# Patient Record
Sex: Female | Born: 1985 | Race: Black or African American
Health system: Southern US, Community
[De-identification: ages and names within clinical notes are randomized; demographics above are authoritative.]

## PROBLEM LIST (undated history)

## (undated) DIAGNOSIS — A64 Unspecified sexually transmitted disease: Secondary | ICD-10-CM

## (undated) DIAGNOSIS — G4730 Sleep apnea, unspecified: Secondary | ICD-10-CM

## (undated) DIAGNOSIS — E669 Obesity, unspecified: Secondary | ICD-10-CM

## (undated) DIAGNOSIS — J302 Other seasonal allergic rhinitis: Secondary | ICD-10-CM

## (undated) DIAGNOSIS — J189 Pneumonia, unspecified organism: Secondary | ICD-10-CM

## (undated) DIAGNOSIS — E785 Hyperlipidemia, unspecified: Secondary | ICD-10-CM

## (undated) DIAGNOSIS — F32A Depression, unspecified: Secondary | ICD-10-CM

## (undated) DIAGNOSIS — F419 Anxiety disorder, unspecified: Secondary | ICD-10-CM

## (undated) DIAGNOSIS — Z87898 Personal history of other specified conditions: Secondary | ICD-10-CM

## (undated) DIAGNOSIS — Z9109 Other allergy status, other than to drugs and biological substances: Secondary | ICD-10-CM

## (undated) DIAGNOSIS — R519 Headache, unspecified: Secondary | ICD-10-CM

## (undated) DIAGNOSIS — K219 Gastro-esophageal reflux disease without esophagitis: Secondary | ICD-10-CM

## (undated) DIAGNOSIS — R2240 Localized swelling, mass and lump, unspecified lower limb: Secondary | ICD-10-CM

## (undated) DIAGNOSIS — A6000 Herpesviral infection of urogenital system, unspecified: Secondary | ICD-10-CM

## (undated) HISTORY — DX: Depression, unspecified: F32.A

## (undated) HISTORY — DX: Other seasonal allergic rhinitis: J30.2

## (undated) HISTORY — PX: COSMETIC SURGERY: SHX468

## (undated) HISTORY — DX: Personal history of other specified conditions: Z87.898

## (undated) HISTORY — DX: Pneumonia, unspecified organism: J18.9

## (undated) HISTORY — DX: Gastro-esophageal reflux disease without esophagitis: K21.9

## (undated) HISTORY — DX: Hyperlipidemia, unspecified: E78.5

## (undated) HISTORY — DX: Morbid (severe) obesity due to excess calories: E66.01

## (undated) HISTORY — DX: Anxiety disorder, unspecified: F41.9

## (undated) HISTORY — DX: Obesity, unspecified: E66.9

## (undated) HISTORY — PX: TONSILLECTOMY: SHX5618

## (undated) HISTORY — DX: Unspecified sexually transmitted disease: A64

## (undated) HISTORY — PX: EXCISION CYST/LIPOMA: SHX510800

## (undated) HISTORY — DX: Sleep apnea, unspecified: G47.30

## (undated) HISTORY — DX: Headache, unspecified: R51.9

## (undated) HISTORY — PX: TONSILLECTOMY: SUR1361

## (undated) NOTE — Anesthesia Preprocedure Evaluation (Signed)
 Formatting of this note might be different from the original.  Pre-Sedation Evaluation    Pre-Sedation Assessment    Patient has been reassessed immediately prior to the procedure and is an appropriate candidate for the planned sedation and procedure. Risk, benefits, and alternatives, to the procedure and planned sedation have been explained to the patient (next of kin/guardian).: Yes      ASA classification: class 2 - patient with mild systemic disease      Physical Evaluation    Rhythm: regular      Rate: normal      Lung Exam: effort normal      Mallampati Score:  II - soft palate, uvula, fauces visible    Previous Anesthesia problems?:  No    Post-procedure Recovery Destination  Post-procedure recovery destination: Recovery unit    Procedure Description:      Electronically signed by Alena An (M.D.), M.D. at 02/28/2022  8:12 AM EST

## (undated) NOTE — Nursing Note (Signed)
 Formatting of this note might be different from the original.  Performed independent double check with  Nurse Alita Irwin, Tiffany prior to the administration of Midazolam (VERSED) and fentaNYL (PF) (SUBLIMAZE). See chart for total dosage.    Completed and verified by:  Claudene Crystal RN, February 28, 2022, 7:58 AM  Electronically signed by Claudene Crystal (R.N.), R.N. at 02/28/2022  7:58 AM EST

## (undated) NOTE — Nursing Note (Signed)
 Formatting of this note might be different from the original.  Intra-Op Meds given  fentanyl 200 mcg and midazolam 6 mg.    Intra-Op Meds wasted   fentanyl 0 mcg and midazolam 0 mg.     Returned 0 vial fentanyl and 0 vial midazolam     with Hebert Littler RN.    Electronically signed by Claudene Crystal (R.N.), R.N. at 02/28/2022  8:20 AM EST

## (undated) NOTE — Nursing Note (Signed)
 Formatting of this note might be different from the original.  Pt awake and tolerating snacks-given D/C instructions and follow up instructions. IV taken out  Electronically signed by Caralyn Chandler (R.N.), R.N. at 02/28/2022  8:40 AM EST

---

## 2010-02-17 HISTORY — PX: HIP SURGERY: SHX245

## 2012-01-16 ENCOUNTER — Emergency Department (HOSPITAL_COMMUNITY)
Admission: EM | Admit: 2012-01-16 | Discharge: 2012-01-16 | Disposition: A | Discharge status: Home / Self Care | Payer: BC Managed Care – PPO | Attending: Emergency Medicine | Admitting: Emergency Medicine

## 2012-01-16 ENCOUNTER — Emergency Department (HOSPITAL_COMMUNITY): Payer: BC Managed Care – PPO

## 2012-01-16 ENCOUNTER — Encounter (HOSPITAL_COMMUNITY): Payer: Self-pay | Admitting: Emergency Medicine

## 2012-01-16 DIAGNOSIS — S99919A Unspecified injury of unspecified ankle, initial encounter: Secondary | ICD-10-CM

## 2012-01-16 DIAGNOSIS — S99929A Unspecified injury of unspecified foot, initial encounter: Secondary | ICD-10-CM | POA: Insufficient documentation

## 2012-01-16 DIAGNOSIS — M899 Disorder of bone, unspecified: Secondary | ICD-10-CM | POA: Insufficient documentation

## 2012-01-16 DIAGNOSIS — M12879 Other specific arthropathies, not elsewhere classified, unspecified ankle and foot: Secondary | ICD-10-CM | POA: Insufficient documentation

## 2012-01-16 DIAGNOSIS — W19XXXA Unspecified fall, initial encounter: Secondary | ICD-10-CM

## 2012-01-16 DIAGNOSIS — F17200 Nicotine dependence, unspecified, uncomplicated: Secondary | ICD-10-CM | POA: Insufficient documentation

## 2012-01-16 DIAGNOSIS — Y929 Unspecified place or not applicable: Secondary | ICD-10-CM | POA: Insufficient documentation

## 2012-01-16 DIAGNOSIS — M7989 Other specified soft tissue disorders: Secondary | ICD-10-CM | POA: Insufficient documentation

## 2012-01-16 DIAGNOSIS — R296 Repeated falls: Secondary | ICD-10-CM | POA: Insufficient documentation

## 2012-01-16 DIAGNOSIS — Y9389 Activity, other specified: Secondary | ICD-10-CM | POA: Insufficient documentation

## 2012-01-16 DIAGNOSIS — M898X7 Other specified disorders of bone, ankle and foot: Secondary | ICD-10-CM

## 2012-01-16 DIAGNOSIS — S8990XA Unspecified injury of unspecified lower leg, initial encounter: Secondary | ICD-10-CM | POA: Insufficient documentation

## 2012-01-16 DIAGNOSIS — R269 Unspecified abnormalities of gait and mobility: Secondary | ICD-10-CM | POA: Insufficient documentation

## 2012-01-16 MED ORDER — HYDROCODONE-ACETAMINOPHEN 5-325 MG PO TABS: 1.0000 | ORAL_TABLET | ORAL | Status: DC | PRN

## 2012-01-16 MED ORDER — IBUPROFEN 800 MG PO TABS
800.0000 mg | ORAL_TABLET | Freq: Once | ORAL | Status: AC
  Administered 2012-01-16: 800 mg via ORAL
  Filled 2012-01-16: qty 1

## 2012-01-16 MED ORDER — NAPROXEN 500 MG PO TABS: 500.0000 mg | ORAL_TABLET | Freq: Two times a day (BID) | ORAL | Status: DC

## 2012-01-16 NOTE — ED Provider Notes (Signed)
Medical screening examination/treatment/procedure(s) were performed by non-physician practitioner and as supervising physician I was immediately available for consultation/collaboration.  Vena Bassinger T Amariyana Heacox, MD 01/16/12 2243 

## 2012-01-16 NOTE — ED Notes (Addendum)
Patient turned foot inward while walking two weeks ago, and now she is having pain on the top of her foot.  No swelling or bruising present.  Patient is able to move foot around but with pain.  Worse when standing for long periods of time. Pt is employed as a Lawyer. Pt rates pain as an 8/10.

## 2012-01-16 NOTE — ED Provider Notes (Signed)
History     CSN: [id number]  Arrival date & time 01/16/12  1525   First MD Initiated Contact with Patient 01/16/12 1547      No chief complaint on file.   (Consider location/radiation/quality/duration/timing/severity/associated sxs/prior treatment) The history is provided by the patient and medical records.    Lynn Sanchez is a 26 y.o. female presents emergency department complaining of right foot pain after fall. Patient states she was walking and stumbled everting her foot. She states she caught herself and did not hit her head. She did not lose consciousness. She does not have pain in any other major joints. She states that immediately after the fall she was able to bear weight however it was particularly painful. She has been able to walk on it since however she's had a gait disturbance secondary to pain. She states symptoms began acutely 2 weeks ago, have been persistent and stabilized. She states she has tried ice and elevation without improvement. Walking on the foot makes it worse, nothing makes it better. She has associated tenderness to palpation along the lateral aspect of the foot and ankle as well as mild swelling in the area. She denies fever, chills, numbness, tingling, footdrop.    History reviewed. No pertinent past medical history.  Past Surgical History  Procedure Date  . Tonsillectomy   . Hip surgery     History reviewed. No pertinent family history.  History  Substance Use Topics  . Smoking status: Light Tobacco Smoker  . Smokeless tobacco: Not on file  . Alcohol Use: No    OB History    Grav Para Term Preterm Abortions TAB SAB Ect Mult Living                  Review of Systems  Musculoskeletal: Positive for joint swelling, arthralgias (right ankle) and gait problem (secondary to pain).  Skin: Negative for color change and wound.  Neurological: Negative for numbness.  All other systems reviewed and are negative.    Allergies   Penicillins  Home Medications   Current Outpatient Rx  Name  Route  Sig  Dispense  Refill  . ACETAMINOPHEN 500 MG PO TABS   Oral   Take 1,000 mg by mouth every 6 (six) hours as needed. pain         . ACYCLOVIR 400 MG PO TABS   Oral   Take 400 mg by mouth 2 (two) times daily.         Marland Kitchen HYDROCODONE-ACETAMINOPHEN 5-325 MG PO TABS   Oral   Take 1 tablet by mouth every 4 (four) hours as needed for pain.   6 tablet   0   . NAPROXEN 500 MG PO TABS   Oral   Take 1 tablet (500 mg total) by mouth 2 (two) times daily with a meal.   30 tablet   0     BP 120/69  Pulse 78  Temp 98 F (36.7 C) (Oral)  Resp 16  SpO2 100%  LMP 12/19/2011  Physical Exam  Nursing note and vitals reviewed. Constitutional: She appears well-developed and well-nourished. No distress.  HENT:  Head: Normocephalic and atraumatic.  Eyes: Conjunctivae normal are normal.  Cardiovascular: Normal rate, regular rhythm, normal heart sounds and intact distal pulses.  Exam reveals no gallop and no friction rub.   No murmur heard.      Capillary refill less than 3 seconds  Pulmonary/Chest: Effort normal and breath sounds normal. No respiratory distress. She has no wheezes.  She has no rales.  Musculoskeletal: She exhibits tenderness (to palpation on the right lateral midfoot and lateral malleolus, mild swelling noted at the site of tenderness no bruising noted at the site of tenderness). She exhibits no edema.       ROM: Full active and passive range of motion of the toes without pain, ankle with pain, and knee without pain on the right.  Full range of motion of the toes, ankle and knee without pain on the left  Neurological: She is alert. Coordination normal.       Sensation intact to sharp and dull bilaterally Strength 5 out of 5 strength of the toes and knee, strong dorsiflexion, decreased plantar flexion secondary to pain  Skin: Skin is warm and dry. She is not diaphoretic. No erythema.    ED Course   Procedures (including critical care time)  Labs Reviewed - No data to display Dg Foot Complete Right  01/16/2012  *RADIOLOGY REPORT*  Clinical Data: Fall.  Lateral foot pain.  RIGHT FOOT COMPLETE - 3+ VIEW  Comparison: None.  Findings: No malalignment at the Lisfranc joint noted.  No fracture of the base of the fifth metatarsal was evident.  The amount of convexity along the base of the first metatarsal is exaggerated, but probably incidental.  Dorsal spurring of the talar head noted.  No discrete fracture is observed.  IMPRESSION:  1.  No acute bony findings. 2.  Dorsal spurring of the talar head.  This is unusual for age, but can be encountered in the setting of underlying tarsal coalition (most commonly talocalcaneal), rheumatoid arthropathy, diffuse idiopathic skeletal hyperostosis, and acromegaly. I do not directly visualize tarsal coalition in this case. 3.  If symptoms persist despite conservative therapy, MRI followup may be warranted.   Original Report Authenticated By: Gaylyn Rong, M.D.      1. Fall   2. Ankle injury   3. Other specified disorders of bone, ankle and foot       MDM  Winfred Burn presents with ankle pain.  Likely sprain of the right ankle, low suspicion for fracture however based on [place] Ankle rules and tenderness to palpation of the lateral malleolus will obtain an x-ray.    X-ray without ear bony findings however a dorsal spurring of the talar head is noted. The patient has no known history of foot or ankle issues.  I have recommended conservative therapy with in ASO splint, rest, ice, compression, elevation.  I have also recommended use of an anti-inflammatory and we will give Norco for pain at night.  I discussed with the patient the need to followup with orthopedist for possible MRI if symptoms persist.  I have also discussed reasons to return immediately to the ER.  Patient expresses understanding and agrees with plan.  1. Medications: Naprosyn, Norco,  usual home medications 2. Treatment: rest, drink plenty of fluids, RICE per instructions 3. Follow Up: Please followup with your primary doctor for discussion of your diagnoses and further evaluation after today's visit; if you do not have a primary care doctor use the resource guide provided to find one; also please followup with [facility] orthopedics per your discharge instructions for further evaluation of your foot pain.        Dahlia Client Anamari Galeas, PA-C 01/16/12 1728

## 2012-01-18 DIAGNOSIS — R2240 Localized swelling, mass and lump, unspecified lower limb: Secondary | ICD-10-CM

## 2012-01-18 HISTORY — DX: Localized swelling, mass and lump, unspecified lower limb: R22.40

## 2012-02-16 ENCOUNTER — Encounter (HOSPITAL_BASED_OUTPATIENT_CLINIC_OR_DEPARTMENT_OTHER): Payer: Self-pay | Admitting: *Deleted

## 2012-02-23 ENCOUNTER — Ambulatory Visit (HOSPITAL_BASED_OUTPATIENT_CLINIC_OR_DEPARTMENT_OTHER)
Admission: RE | Admit: 2012-02-23 | Discharge: 2012-02-23 | Disposition: A | Discharge status: Home / Self Care | Payer: BC Managed Care – PPO | Source: Ambulatory Visit | Attending: Specialist | Admitting: Specialist

## 2012-02-23 ENCOUNTER — Encounter (HOSPITAL_BASED_OUTPATIENT_CLINIC_OR_DEPARTMENT_OTHER): Payer: Self-pay | Admitting: Anesthesiology

## 2012-02-23 ENCOUNTER — Encounter (HOSPITAL_BASED_OUTPATIENT_CLINIC_OR_DEPARTMENT_OTHER): Payer: Self-pay | Admitting: *Deleted

## 2012-02-23 ENCOUNTER — Ambulatory Visit (HOSPITAL_BASED_OUTPATIENT_CLINIC_OR_DEPARTMENT_OTHER): Payer: BC Managed Care – PPO | Admitting: Anesthesiology

## 2012-02-23 ENCOUNTER — Encounter (HOSPITAL_BASED_OUTPATIENT_CLINIC_OR_DEPARTMENT_OTHER)
Admission: RE | Disposition: A | Discharge status: Home / Self Care | Payer: Self-pay | Source: Ambulatory Visit | Attending: Specialist

## 2012-02-23 DIAGNOSIS — Z01812 Encounter for preprocedural laboratory examination: Secondary | ICD-10-CM | POA: Insufficient documentation

## 2012-02-23 DIAGNOSIS — D1739 Benign lipomatous neoplasm of skin and subcutaneous tissue of other sites: Secondary | ICD-10-CM | POA: Insufficient documentation

## 2012-02-23 DIAGNOSIS — A6000 Herpesviral infection of urogenital system, unspecified: Secondary | ICD-10-CM | POA: Insufficient documentation

## 2012-02-23 DIAGNOSIS — F17200 Nicotine dependence, unspecified, uncomplicated: Secondary | ICD-10-CM | POA: Insufficient documentation

## 2012-02-23 DIAGNOSIS — J860 Pyothorax with fistula: Secondary | ICD-10-CM | POA: Insufficient documentation

## 2012-02-23 DIAGNOSIS — L910 Hypertrophic scar: Secondary | ICD-10-CM | POA: Insufficient documentation

## 2012-02-23 HISTORY — DX: Herpesviral infection of urogenital system, unspecified: A60.00

## 2012-02-23 HISTORY — PX: MASS EXCISION: SHX2000

## 2012-02-23 HISTORY — PX: LIPOSUCTION: SHX10

## 2012-02-23 HISTORY — PX: SCAR REVISION: SHX5285

## 2012-02-23 HISTORY — DX: Gastro-esophageal reflux disease without esophagitis: K21.9

## 2012-02-23 HISTORY — DX: Localized swelling, mass and lump, unspecified lower limb: R22.40

## 2012-02-23 SURGERY — EXCISION MASS
Anesthesia: General | Site: Hip | Laterality: Left | Wound class: Clean

## 2012-02-23 MED ORDER — SODIUM CHLORIDE 0.9 % IV SOLN
INTRAVENOUS | Status: DC | PRN
  Administered 2012-02-23: 200 mL via INTRAMUSCULAR

## 2012-02-23 MED ORDER — PROPOFOL 10 MG/ML IV BOLUS
INTRAVENOUS | Status: DC | PRN
  Administered 2012-02-23: 200 mg via INTRAVENOUS

## 2012-02-23 MED ORDER — ONDANSETRON HCL 4 MG/2ML IJ SOLN: 4.0000 mg | Freq: Four times a day (QID) | INTRAMUSCULAR | Status: DC | PRN

## 2012-02-23 MED ORDER — MORPHINE SULFATE 2 MG/ML IJ SOLN
1.0000 mg | INTRAMUSCULAR | Status: DC | PRN
  Administered 2012-02-23 (×2): 2 mg via INTRAVENOUS

## 2012-02-23 MED ORDER — FENTANYL CITRATE 0.05 MG/ML IJ SOLN
INTRAMUSCULAR | Status: DC | PRN
  Administered 2012-02-23: 25 ug via INTRAVENOUS
  Administered 2012-02-23: 100 ug via INTRAVENOUS

## 2012-02-23 MED ORDER — MIDAZOLAM HCL 5 MG/5ML IJ SOLN
INTRAMUSCULAR | Status: DC | PRN
  Administered 2012-02-23: 2 mg via INTRAVENOUS

## 2012-02-23 MED ORDER — LIDOCAINE-EPINEPHRINE 0.5 %-1:200000 IJ SOLN
INTRAMUSCULAR | Status: DC | PRN
  Administered 2012-02-23: 100 mL

## 2012-02-23 MED ORDER — OXYCODONE HCL 5 MG PO TABS
5.0000 mg | ORAL_TABLET | Freq: Once | ORAL | Status: AC | PRN
  Administered 2012-02-23: 5 mg via ORAL

## 2012-02-23 MED ORDER — ONDANSETRON HCL 4 MG/2ML IJ SOLN
INTRAMUSCULAR | Status: DC | PRN
  Administered 2012-02-23: 4 mg via INTRAVENOUS

## 2012-02-23 MED ORDER — LACTATED RINGERS IV SOLN
INTRAVENOUS | Status: DC
  Administered 2012-02-23 (×2): via INTRAVENOUS

## 2012-02-23 MED ORDER — OXYCODONE HCL 5 MG/5ML PO SOLN: 5.0000 mg | Freq: Once | ORAL | Status: AC | PRN

## 2012-02-23 MED ORDER — DEXAMETHASONE SODIUM PHOSPHATE 4 MG/ML IJ SOLN
INTRAMUSCULAR | Status: DC | PRN
  Administered 2012-02-23: 10 mg via INTRAVENOUS

## 2012-02-23 MED ORDER — CEFAZOLIN SODIUM-DEXTROSE 2-3 GM-% IV SOLR
2.0000 g | INTRAVENOUS | Status: AC
  Administered 2012-02-23: 2 g via INTRAVENOUS

## 2012-02-23 MED ORDER — LIDOCAINE HCL (CARDIAC) 20 MG/ML IV SOLN
INTRAVENOUS | Status: DC | PRN
  Administered 2012-02-23: 70 mg via INTRAVENOUS

## 2012-02-23 MED ORDER — SUCCINYLCHOLINE CHLORIDE 20 MG/ML IJ SOLN
INTRAMUSCULAR | Status: DC | PRN
  Administered 2012-02-23: 100 mg via INTRAVENOUS

## 2012-02-23 SURGICAL SUPPLY — 94 items
BAG DECANTER FOR FLEXI CONT (MISCELLANEOUS) ×3 IMPLANT
BANDAGE ELASTIC 4 VELCRO ST LF (GAUZE/BANDAGES/DRESSINGS) IMPLANT
BANDAGE GAUZE ELAST BULKY 4 IN (GAUZE/BANDAGES/DRESSINGS) IMPLANT
BENZOIN TINCTURE PRP APPL 2/3 (GAUZE/BANDAGES/DRESSINGS) ×3 IMPLANT
BINDER ABD UNIV 12 30-45 (MISCELLANEOUS) ×2 IMPLANT
BINDER ABDOMINAL 12 (MISCELLANEOUS) ×3
BLADE HEX COATED 2.75 (ELECTRODE) IMPLANT
BLADE KNIFE PERSONA 10 (BLADE) IMPLANT
BLADE KNIFE PERSONA 15 (BLADE) ×3 IMPLANT
BNDG COHESIVE 4X5 TAN STRL (GAUZE/BANDAGES/DRESSINGS) IMPLANT
CANISTER LINER 1300 C W/ELBOW (MISCELLANEOUS) ×3 IMPLANT
CANISTER SUCTION 1200CC (MISCELLANEOUS) ×3 IMPLANT
CLEANER CAUTERY TIP 5X5 PAD (MISCELLANEOUS) ×2 IMPLANT
CLOTH BEACON ORANGE TIMEOUT ST (SAFETY) ×3 IMPLANT
COTTONBALL LRG STERILE PKG (GAUZE/BANDAGES/DRESSINGS) IMPLANT
COVER MAYO STAND STRL (DRAPES) ×3 IMPLANT
COVER TABLE BACK 60X90 (DRAPES) ×3 IMPLANT
DECANTER SPIKE VIAL GLASS SM (MISCELLANEOUS) IMPLANT
DRAIN CHANNEL 10F 3/8 F FF (DRAIN) ×3 IMPLANT
DRAPE LAPAROTOMY TRNSV 102X78 (DRAPE) IMPLANT
DRAPE PED LAPAROTOMY (DRAPES) ×3 IMPLANT
DRAPE U-SHAPE 76X120 STRL (DRAPES) IMPLANT
DRSG PAD ABDOMINAL 8X10 ST (GAUZE/BANDAGES/DRESSINGS) IMPLANT
ELECT NEEDLE TIP 2.8 STRL (NEEDLE) ×3 IMPLANT
ELECT REM PT RETURN 9FT ADLT (ELECTROSURGICAL) ×3
ELECTRODE REM PT RTRN 9FT ADLT (ELECTROSURGICAL) ×2 IMPLANT
EVACUATOR SILICONE 100CC (DRAIN) ×3 IMPLANT
FILTER 7/8 IN (FILTER) IMPLANT
FILTER LIPOSUCTION (MISCELLANEOUS) ×3 IMPLANT
GAUZE SPONGE 4X4 12PLY STRL LF (GAUZE/BANDAGES/DRESSINGS) IMPLANT
GAUZE SPONGE 4X4 16PLY XRAY LF (GAUZE/BANDAGES/DRESSINGS) IMPLANT
GAUZE XEROFORM 1X8 LF (GAUZE/BANDAGES/DRESSINGS) ×3 IMPLANT
GAUZE XEROFORM 5X9 LF (GAUZE/BANDAGES/DRESSINGS) IMPLANT
GLOVE BIO SURGEON STRL SZ 6.5 (GLOVE) ×3 IMPLANT
GLOVE BIOGEL M STRL SZ7.5 (GLOVE) ×3 IMPLANT
GLOVE BIOGEL PI IND STRL 8 (GLOVE) ×2 IMPLANT
GLOVE BIOGEL PI INDICATOR 8 (GLOVE) ×1
GLOVE ECLIPSE 7.0 STRL STRAW (GLOVE) ×3 IMPLANT
GOWN PREVENTION PLUS XLARGE (GOWN DISPOSABLE) IMPLANT
GOWN PREVENTION PLUS XXLARGE (GOWN DISPOSABLE) ×3 IMPLANT
IV LACTATED RINGERS 1000ML (IV SOLUTION) IMPLANT
IV NS 500ML (IV SOLUTION) ×1
IV NS 500ML BAXH (IV SOLUTION) ×2 IMPLANT
NDL SAFETY ECLIPSE 18X1.5 (NEEDLE) IMPLANT
NEEDLE FILTER BLUNT 18X 1/2SAF (NEEDLE)
NEEDLE FILTER BLUNT 18X1 1/2 (NEEDLE) IMPLANT
NEEDLE HYPO 18GX1.5 SHARP (NEEDLE)
NEEDLE HYPO 25X1 1.5 SAFETY (NEEDLE) ×3 IMPLANT
NEEDLE SPNL 18GX3.5 QUINCKE PK (NEEDLE) ×3 IMPLANT
PACK BASIN DAY SURGERY FS (CUSTOM PROCEDURE TRAY) ×3 IMPLANT
PAD CLEANER CAUTERY TIP 5X5 (MISCELLANEOUS) ×1
PENCIL BUTTON HOLSTER BLD 10FT (ELECTRODE) ×3 IMPLANT
SHEET MEDIUM DRAPE 40X70 STRL (DRAPES) ×3 IMPLANT
SHEETING SILICONE GEL EPI DERM (MISCELLANEOUS) ×3 IMPLANT
SLEEVE SCD COMPRESS KNEE MED (MISCELLANEOUS) ×3 IMPLANT
SPONGE GAUZE 4X4 12PLY (GAUZE/BANDAGES/DRESSINGS) IMPLANT
SPONGE LAP 18X18 X RAY DECT (DISPOSABLE) ×3 IMPLANT
STAPLER VISISTAT 35W (STAPLE) IMPLANT
STOCKINETTE 4X48 STRL (DRAPES) IMPLANT
STOCKINETTE IMPERVIOUS LG (DRAPES) IMPLANT
STRIP CLOSURE SKIN 1/2X4 (GAUZE/BANDAGES/DRESSINGS) IMPLANT
STRIP CLOSURE SKIN 1/8X3 (GAUZE/BANDAGES/DRESSINGS) IMPLANT
STRIP SUTURE WOUND CLOSURE 1/2 (SUTURE) IMPLANT
SUCTION FRAZIER TIP 10 FR DISP (SUCTIONS) IMPLANT
SUT ETHILON 3 0 PS 1 (SUTURE) ×3 IMPLANT
SUT FIBERWIRE 3-0 18 DIAM 3/8 (SUTURE)
SUT MNCRL AB 3-0 PS2 18 (SUTURE) IMPLANT
SUT MON AB 2-0 CT1 36 (SUTURE) IMPLANT
SUT MON AB 4-0 PC3 18 (SUTURE) IMPLANT
SUT MON AB 5-0 PS2 18 (SUTURE) IMPLANT
SUT PROLENE 4 0 P 3 18 (SUTURE) IMPLANT
SUT PROLENE 4 0 PS 2 18 (SUTURE) ×3 IMPLANT
SUT PROLENE 5 0 P 3 (SUTURE) IMPLANT
SUT PROLENE 5 0 PS 2 (SUTURE) IMPLANT
SUT PROLENE 6 0 P 1 18 (SUTURE) IMPLANT
SUT SILK 3 0 PS 1 (SUTURE) IMPLANT
SUT STRIPS 1/4 X 4 INCH (SUTURE) IMPLANT
SUT VIC AB 0 CT1 27 (SUTURE)
SUT VIC AB 0 CT1 27XBRD ANBCTR (SUTURE) IMPLANT
SUTURE FIBERWR 3-0 18 DIAM 3/8 (SUTURE) IMPLANT
SYR 20CC LL (SYRINGE) IMPLANT
SYR 50ML LL SCALE MARK (SYRINGE) ×6 IMPLANT
SYR BULB IRRIGATION 50ML (SYRINGE) IMPLANT
SYR CONTROL 10ML LL (SYRINGE) ×3 IMPLANT
TAPE HYPAFIX 6X30 (GAUZE/BANDAGES/DRESSINGS) IMPLANT
TAPE PAPER MEDFIX 1IN X 10YD (GAUZE/BANDAGES/DRESSINGS) IMPLANT
TOWEL OR 17X24 6PK STRL BLUE (TOWEL DISPOSABLE) ×6 IMPLANT
TRAY DSU PREP LF (CUSTOM PROCEDURE TRAY) ×3 IMPLANT
TUBE CONNECTING 20X1/4 (TUBING) ×3 IMPLANT
TUBING SET GRADUATE ASPIR 12FT (MISCELLANEOUS) ×3 IMPLANT
UNDERPAD 30X30 INCONTINENT (UNDERPADS AND DIAPERS) ×3 IMPLANT
VAC PENCILS W/TUBING CLEAR (MISCELLANEOUS) ×3 IMPLANT
WATER STERILE IRR 1000ML POUR (IV SOLUTION) ×3 IMPLANT
YANKAUER SUCT BULB TIP NO VENT (SUCTIONS) ×3 IMPLANT

## 2012-02-23 NOTE — Anesthesia Preprocedure Evaluation (Signed)
Anesthesia Evaluation  Patient identified by MRN, date of birth, ID band Patient awake    Reviewed: Allergy & Precautions, H&P , NPO status , Patient's Chart, lab work & pertinent test results  Airway Mallampati: II  Neck ROM: full    Dental   Pulmonary Current Smoker,          Cardiovascular     Neuro/Psych    GI/Hepatic GERD-  ,  Endo/Other  obese  Renal/GU      Musculoskeletal   Abdominal   Peds  Hematology   Anesthesia Other Findings   Reproductive/Obstetrics                           Anesthesia Physical Anesthesia Plan  ASA: II  Anesthesia Plan: General   Post-op Pain Management:    Induction: Intravenous  Airway Management Planned: Oral ETT  Additional Equipment:   Intra-op Plan:   Post-operative Plan: Extubation in OR  Informed Consent: I have reviewed the patients History and Physical, chart, labs and discussed the procedure including the risks, benefits and alternatives for the proposed anesthesia with the patient or authorized representative who has indicated his/her understanding and acceptance.     Plan Discussed with: CRNA and Surgeon  Anesthesia Plan Comments:         Anesthesia Quick Evaluation

## 2012-02-23 NOTE — H&P (Signed)
Lynn Sanchez is an 27 y.o. female.   Chief Complaint: Recurrent mass left hip flank area HPI: Increased pain and discomfort decreased range of motion and muscular pain on movement. Previously removed at [facility]. ALSO PAINFUL SEVERE SCAR FROM LAST ATTEMPT AT REMOVAL  Past Medical History  Diagnosis Date  . GERD (gastroesophageal reflux disease)     Zantac prn  . Mass of hip region 01/2012    left  . Genital herpes     Past Surgical History  Procedure Date  . Tonsillectomy   . Hip surgery 2012    left    History reviewed. No pertinent family history. Social History:  reports that she has been smoking Cigars.  She has never used smokeless tobacco. She reports that she drinks alcohol. She reports that she does not use illicit drugs.  Allergies:  Allergies  Allergen Reactions  . Penicillins Other (See Comments)    AS A CHILD - UNKNOWN REACTION    Medications Prior to Admission  Medication Sig Dispense Refill  . acetaminophen (TYLENOL) 500 MG tablet Take 1,000 mg by mouth every 6 (six) hours as needed. pain      . acyclovir (ZOVIRAX) 400 MG tablet Take 400 mg by mouth 2 (two) times daily.      Marland Kitchen HYDROcodone-acetaminophen (NORCO/VICODIN) 5-325 MG per tablet Take 1 tablet by mouth every 4 (four) hours as needed for pain.  6 tablet  0  . naproxen (NAPROSYN) 500 MG tablet Take 1 tablet (500 mg total) by mouth 2 (two) times daily with a meal.  30 tablet  0    No results found for this or any previous visit (from the past 48 hour(s)). No results found.  Review of Systems  Constitutional: Negative.   HENT: Negative.   Eyes: Negative.   Respiratory: Negative.   Cardiovascular: Negative.   Gastrointestinal: Negative.   Genitourinary: Negative.   Musculoskeletal: Negative.   Skin: Negative.   Neurological: Negative.   Endo/Heme/Allergies: Negative.   Psychiatric/Behavioral: Negative.     Blood pressure 99/64, pulse 78, temperature 98.2 F (36.8 C), temperature  source Oral, resp. rate 20, height 5\' 8"  (1.727 m), weight 97.251 kg (214 lb 6.4 oz), last menstrual period 02/19/2012, SpO2 99.00%. 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

## 2012-02-23 NOTE — Anesthesia Postprocedure Evaluation (Signed)
Anesthesia Post Note  Patient: Lynn Sanchez  Procedure(s) Performed: Procedure(s) (LRB): EXCISION MASS (Left) LIPOSUCTION (Left) SCAR REVISION (Left)  Anesthesia type: General  Patient location: PACU  Post pain: Pain level controlled and Adequate analgesia  Post assessment: Post-op Vital signs reviewed, Patient's Cardiovascular Status Stable, Respiratory Function Stable, Patent Airway and Pain level controlled  Last Vitals:  Filed Vitals:   02/23/12 0945  BP:   Pulse: 69  Temp:   Resp: 21    Post vital signs: Reviewed and stable  Level of consciousness: awake, alert  and oriented  Complications: No apparent anesthesia complications

## 2012-02-23 NOTE — Transfer of Care (Signed)
Immediate Anesthesia Transfer of Care Note  Patient: Lynn Sanchez  Procedure(s) Performed: Procedure(s) (LRB) with comments: EXCISION MASS (Left) - EXCISION OF LARGE MASS LEFT HIP LIPOSUCTION  ASSIST LIPOSUCTION (Left) SCAR REVISION (Left) - SCAR REVISION LEFT HIP   Patient Location: PACU  Anesthesia Type:General  Level of Consciousness: awake  Airway & Oxygen Therapy: Patient Spontanous Breathing and Patient connected to face mask oxygen  Post-op Assessment: Report given to PACU RN and Post -op Vital signs reviewed and stable  Post vital signs: Reviewed and stable  Complications: No apparent anesthesia complications

## 2012-02-23 NOTE — Op Note (Signed)
NAME:  Lynn Sanchez, BOISSONNEAULT              ACCOUNT NO.:  [id number]  MEDICAL RECORD NO.:  [id number]  LOCATION:                                 FACILITY:  PHYSICIAN:  Earvin Hansen L. 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  Had previously had it removed at [facility] approximately year and a half ago.  It has recurred with increased pain, discomfort, decreased range of motion secondary to the large mass.  She also has deformity and scarring involving the area.  PROCEDURES PLANNED:  Excision of scar with reconstruction with flap closure.  Excision of mass, re-excision of recurrent mass on the left flank with liposuction assistance.  ANESTHESIA:  General.  Preoperatively, the patient was sat up and drawn for the mass.  The patient underwent general anesthesia, intubated orally.  Prep was done to the back areas and groin with Hibiclens soap and solution, walled off with sterile towels and drapes so as to make a sterile field.  0.25% Xylocaine with epinephrine 1:400,000 concentration was injected locally, a total of 3 mL.  After waiting appropriate time, excision of the larger scar was done down the underlying deep subcutaneous tissue.  Using a Metzenbaum scissors, excised multiple areas of lipoma and then used liposuction to smooth out the edges around the whole circumference of the mass removing over 500 mL with that technique.  After then happy with the contour and the defect and proper hemostasis, the wound was closed of scar.  Advancement flap closure with deep sutures of 2-0 Monocryl x2 layers, a subdermal suture of 3-0 Monocryl and running subcuticular stitch of 3-0 Monocryl to the areas.  The wounds were drained with #10  fully-fluted Blake drain, which was placed in the depths of wound and brought out through the lateral-most portion of the incision and secured with 3-0 Prolene.  The wounds were cleansed, Steri- Strips and soft dressing applied to all the areas.  4x4s, ABDs, Hypafix tape.  The drain was suctioned properly postoperatively.  She was then taken to recovery in excellent condition.     [name]. Shon Hough, M.D.     Cathie Hoops  D:  02/23/2012  T:  02/23/2012  Job:  [id number]

## 2012-02-23 NOTE — Brief Op Note (Signed)
02/23/2012  8:54 AM  PATIENT:  Lynn Sanchez  27 y.o. female  PRE-OPERATIVE DIAGNOSIS:  large mass left hip   POST-OPERATIVE DIAGNOSIS:  * No post-op diagnosis entered *  PROCEDURE:  Procedure(s) (LRB) with comments: EXCISION MASS (Left) - EXCISION OF LARGE MASS LEFT HIP LIPOSUCTION  ASSIST LIPOSUCTION (Left) SCAR REVISION (Left) - SCAR REVISION LEFT HIP   SURGEON:  Surgeon(s) and Role:    * Louisa Second, MD - Primary  PHYSICIAN ASSISTANT:   ASSISTANTS: none   ANESTHESIA:   general  EBL:  Total I/O In: 1800 [I.V.:1800] Out: -   BLOOD ADMINISTERED:none  DRAINS: (left lateral portion of the wound) Jackson-Pratt drain(s) with closed bulb suction in the    LOCAL MEDICATIONS USED:  LIDOCAINE   SPECIMEN:  Excision  DISPOSITION OF SPECIMEN:  PATHOLOGY  COUNTS:  YES  TOURNIQUET:  * No tourniquets in log *  DICTATION: .Other Dictation: Dictation Number [id number]  PLAN OF CARE: Discharge to home after PACU  PATIENT DISPOSITION:  PACU - hemodynamically stable.   Delay start of Pharmacological VTE agent (>24hrs) due to surgical blood loss or risk of bleeding: yes

## 2012-02-23 NOTE — Anesthesia Procedure Notes (Signed)
Procedure Name: Intubation Performed by: Tinsley Everman W Pre-anesthesia Checklist: Patient identified, Timeout performed, Emergency Drugs available, Suction available and Patient being monitored Patient Re-evaluated:Patient Re-evaluated prior to inductionOxygen Delivery Method: Circle system utilized Preoxygenation: Pre-oxygenation with 100% oxygen Intubation Type: IV induction Ventilation: Mask ventilation without difficulty Laryngoscope Size: Miller and 2 Grade View: Grade II Tube type: Oral Tube size: 7.0 mm Number of attempts: 1 Airway Equipment and Method: Stylet Placement Confirmation: breath sounds checked- equal and bilateral and positive ETCO2 Secured at: 23 cm Tube secured with: Tape Dental Injury: Teeth and Oropharynx as per pre-operative assessment      

## 2012-02-24 ENCOUNTER — Encounter (HOSPITAL_BASED_OUTPATIENT_CLINIC_OR_DEPARTMENT_OTHER): Payer: Self-pay | Admitting: Specialist

## 2013-01-24 ENCOUNTER — Emergency Department (HOSPITAL_COMMUNITY)
Admission: EM | Admit: 2013-01-24 | Discharge: 2013-01-24 | Disposition: A | Discharge status: Home / Self Care | Payer: BC Managed Care – PPO | Attending: Emergency Medicine | Admitting: Emergency Medicine

## 2013-01-24 ENCOUNTER — Encounter (HOSPITAL_COMMUNITY): Payer: Self-pay | Admitting: Emergency Medicine

## 2013-01-24 ENCOUNTER — Emergency Department (HOSPITAL_COMMUNITY): Payer: BC Managed Care – PPO

## 2013-01-24 DIAGNOSIS — A6000 Herpesviral infection of urogenital system, unspecified: Secondary | ICD-10-CM | POA: Insufficient documentation

## 2013-01-24 DIAGNOSIS — W1809XA Striking against other object with subsequent fall, initial encounter: Secondary | ICD-10-CM | POA: Insufficient documentation

## 2013-01-24 DIAGNOSIS — Z880 Allergy status to penicillin: Secondary | ICD-10-CM | POA: Insufficient documentation

## 2013-01-24 DIAGNOSIS — Z87891 Personal history of nicotine dependence: Secondary | ICD-10-CM | POA: Insufficient documentation

## 2013-01-24 DIAGNOSIS — S46012A Strain of muscle(s) and tendon(s) of the rotator cuff of left shoulder, initial encounter: Secondary | ICD-10-CM

## 2013-01-24 DIAGNOSIS — Y9389 Activity, other specified: Secondary | ICD-10-CM | POA: Insufficient documentation

## 2013-01-24 DIAGNOSIS — S43429A Sprain of unspecified rotator cuff capsule, initial encounter: Secondary | ICD-10-CM | POA: Insufficient documentation

## 2013-01-24 DIAGNOSIS — Z79899 Other long term (current) drug therapy: Secondary | ICD-10-CM | POA: Insufficient documentation

## 2013-01-24 DIAGNOSIS — Y92009 Unspecified place in unspecified non-institutional (private) residence as the place of occurrence of the external cause: Secondary | ICD-10-CM | POA: Insufficient documentation

## 2013-01-24 MED ORDER — IBUPROFEN 600 MG PO TABS: 600.0000 mg | ORAL_TABLET | Freq: Four times a day (QID) | ORAL | Status: AC | PRN

## 2013-01-24 MED ORDER — HYDROCODONE-ACETAMINOPHEN 5-325 MG PO TABS: 1.0000 | ORAL_TABLET | Freq: Four times a day (QID) | ORAL | Status: AC | PRN

## 2013-01-24 MED ORDER — IBUPROFEN 200 MG PO TABS
600.0000 mg | ORAL_TABLET | Freq: Once | ORAL | Status: AC
  Administered 2013-01-24: 600 mg via ORAL
  Filled 2013-01-24: qty 3

## 2013-01-24 NOTE — ED Notes (Signed)
Patient reports that she tripped and fell into her dresser and is now having left shoulder pain. Pain is worse with movement.

## 2013-01-24 NOTE — ED Provider Notes (Signed)
CSN: [id number]     Arrival date & time 01/24/13  0945 History   First MD Initiated Contact with Patient 01/24/13 1024     Chief Complaint  Patient presents with  . Shoulder Injury   (Consider location/radiation/quality/duration/timing/severity/associated sxs/prior Treatment) HPI Comments: Pt comes in with cc of shoulder pain. Pt's pain started 3 days ago after she fell. Pain is in the left sided of the shoulder - and is worse with positions, specifically with her raising her arm, trying to put her tshirt on, turning the arm back etc. She thinks she heard a pop with a fall.  Patient is a 27 y.o. female presenting with shoulder injury. The history is provided by the patient.  Shoulder Injury    Past Medical History  Diagnosis Date  . GERD (gastroesophageal reflux disease)     Zantac prn  . Mass of hip region 01/2012    left  . Genital herpes    Past Surgical History  Procedure Laterality Date  . Tonsillectomy    . Hip surgery  2012    left  . Mass excision  02/23/2012    Procedure: EXCISION MASS;  Surgeon: Louisa Second, MD;  Location: [facility];  Service: Plastics;  Laterality: Left;  EXCISION OF LARGE MASS LEFT HIP LIPOSUCTION  ASSIST  . Liposuction  02/23/2012    Procedure: LIPOSUCTION;  Surgeon: Louisa Second, MD;  Location: [facility];  Service: Plastics;  Laterality: Left;  . Scar revision  02/23/2012    Procedure: SCAR REVISION;  Surgeon: Louisa Second, MD;  Location: [facility];  Service: Plastics;  Laterality: Left;  SCAR REVISION LEFT HIP    Family History  Problem Relation Age of Onset  . Hypertension Mother   . Diabetes Father   . Hypertension Father    History  Substance Use Topics  . Smoking status: Former Smoker -- 1 years  . Smokeless tobacco: Never Used     Comment: a cigar 2 x/month  . Alcohol Use: Yes     Comment: alcohol 1-2 days a week.   OB History   Grav Para Term Preterm Abortions TAB SAB Ect  Mult Living                 Review of Systems  Constitutional: Negative for activity change.  Musculoskeletal: Positive for arthralgias. Negative for back pain, neck pain and neck stiffness.  Skin: Negative for wound.  Hematological: Does not bruise/bleed easily.    Allergies  Penicillins  Home Medications   Current Outpatient Rx  Name  Route  Sig  Dispense  Refill  . acetaminophen (TYLENOL) 500 MG tablet   Oral   Take 1,000 mg by mouth every 6 (six) hours as needed. pain         . acyclovir (ZOVIRAX) 400 MG tablet   Oral   Take 400 mg by mouth 2 (two) times daily.         . naproxen sodium (ANAPROX) 220 MG tablet   Oral   Take 440 mg by mouth 2 (two) times daily with a meal.          BP 129/82  Pulse 77  Temp(Src) 98.6 F (37 C) (Oral)  Resp 20  SpO2 94%  LMP 12/29/2012 Physical Exam  Nursing note and vitals reviewed. Constitutional: She appears well-developed.  HENT:  Head: Normocephalic.  Eyes: Conjunctivae are normal.  Neck: Neck supple.  Pulmonary/Chest: Effort normal.  Musculoskeletal:  Pt has tenderness with  raising the arm above the level of the plane with Abduction and forward flexion and with internal rotation of the shoulder.    ED Course  Procedures (including critical care time) Labs Review Labs Reviewed - No data to display Imaging Review Dg Shoulder Left  01/24/2013   CLINICAL DATA:  Left shoulder pain post fall 3 days ago  EXAM: LEFT SHOULDER - 2+ VIEW  COMPARISON:  None.  FINDINGS: There is no evidence of fracture or dislocation. There is no evidence of arthropathy or other focal bone abnormality. Soft tissues are unremarkable.  IMPRESSION: Negative.   Electronically Signed   By: Natasha Mead M.D.   On: 01/24/2013 11:57    EKG Interpretation   None       MDM  No diagnosis found.  Pt comes in with cc of fall. Has shoulder pain and injury - and exam shows likely rotator cuff injury. Xray shows no acute fx.    Derwood Kaplan,  MD 01/24/13 1217

## 2013-05-11 ENCOUNTER — Encounter: Payer: Self-pay | Admitting: Emergency Medicine

## 2013-05-11 ENCOUNTER — Emergency Department (INDEPENDENT_AMBULATORY_CARE_PROVIDER_SITE_OTHER): Payer: BC Managed Care – PPO

## 2013-05-11 ENCOUNTER — Emergency Department (INDEPENDENT_AMBULATORY_CARE_PROVIDER_SITE_OTHER)
Admission: EM | Admit: 2013-05-11 | Discharge: 2013-05-11 | Disposition: A | Discharge status: Home / Self Care | Payer: BC Managed Care – PPO | Source: Home / Self Care | Attending: Emergency Medicine | Admitting: Emergency Medicine

## 2013-05-11 DIAGNOSIS — M79609 Pain in unspecified limb: Secondary | ICD-10-CM

## 2013-05-11 DIAGNOSIS — M79669 Pain in unspecified lower leg: Secondary | ICD-10-CM

## 2013-05-11 MED ORDER — TRAMADOL HCL 50 MG PO TABS: 50.0000 mg | ORAL_TABLET | Freq: Four times a day (QID) | ORAL | Status: AC | PRN

## 2013-05-11 NOTE — ED Provider Notes (Signed)
CSN: [id number]     Arrival date & time 05/11/13  4166 History   First MD Initiated Contact with Patient 05/11/13 0901     Chief Complaint  Patient presents with  . Leg Pain  . Knee Pain   (Consider location/radiation/quality/duration/timing/severity/associated sxs/prior Treatment) HPI She was getting up to use the restroom last night and tripped and fell hitting her knee, twisted her knee and hitting her left shin.  She comes in today for evaluation.  No previous trauma.  She describes as a mild to moderate pain, worse with movement, better with resting.  The main pain is located in her anterior mid shin and her knee pain is only very mild.  Past Medical History  Diagnosis Date  . GERD (gastroesophageal reflux disease)     Zantac prn  . Mass of hip region 01/2012    left  . Genital herpes    Past Surgical History  Procedure Laterality Date  . Tonsillectomy    . Hip surgery  2012    left  . Mass excision  02/23/2012    Procedure: EXCISION MASS;  Surgeon: Cristine Polio, MD;  Location: [facility];  Service: Plastics;  Laterality: Left;  EXCISION OF LARGE MASS LEFT HIP LIPOSUCTION  ASSIST  . Liposuction  02/23/2012    Procedure: LIPOSUCTION;  Surgeon: Cristine Polio, MD;  Location: [facility];  Service: Plastics;  Laterality: Left;  . Scar revision  02/23/2012    Procedure: SCAR REVISION;  Surgeon: Cristine Polio, MD;  Location: [facility];  Service: Plastics;  Laterality: Left;  SCAR REVISION LEFT HIP    Family History  Problem Relation Age of Onset  . Hypertension Mother   . Diabetes Father   . Hypertension Father   . Diabetes Brother    History  Substance Use Topics  . Smoking status: Former Smoker -- 1 years  . Smokeless tobacco: Never Used     Comment: a cigar 2 x/month  . Alcohol Use: Yes     Comment: alcohol 1-2 days a week.   OB History   Grav Para Term Preterm Abortions TAB SAB Ect Mult Living                  Review of Systems  All other systems reviewed and are negative.    Allergies  Penicillins  Home Medications   Current Outpatient Rx  Name  Route  Sig  Dispense  Refill  . Norgestimate-Ethinyl Estradiol Triphasic (ORTHO TRI-CYCLEN LO) 0.18/0.215/0.25 MG-25 MCG tab   Oral   Take 1 tablet by mouth daily.         Marland Kitchen acetaminophen (TYLENOL) 500 MG tablet   Oral   Take 1,000 mg by mouth every 6 (six) hours as needed. pain         . acyclovir (ZOVIRAX) 400 MG tablet   Oral   Take 400 mg by mouth 2 (two) times daily.         Marland Kitchen HYDROcodone-acetaminophen (NORCO/VICODIN) 5-325 MG per tablet   Oral   Take 1 tablet by mouth every 6 (six) hours as needed.   15 tablet   0   . ibuprofen (ADVIL,MOTRIN) 600 MG tablet   Oral   Take 1 tablet (600 mg total) by mouth every 6 (six) hours as needed.   30 tablet   0   . naproxen sodium (ANAPROX) 220 MG tablet   Oral   Take 440 mg by mouth 2 (two) times daily  with a meal.         . traMADol (ULTRAM) 50 MG tablet   Oral   Take 1 tablet (50 mg total) by mouth every 6 (six) hours as needed.   20 tablet   0    BP 127/80  Pulse 79  Temp(Src) 98 F (36.7 C) (Oral)  Resp 18  Ht 5\' 8"  (1.727 m)  Wt 230 lb (104.327 kg)  BMI 34.98 kg/m2  SpO2 99%  LMP 05/11/2013 Physical Exam  Nursing note and vitals reviewed. Constitutional: She is oriented to person, place, and time. She appears well-developed and well-nourished.  HENT:  Head: Normocephalic and atraumatic.  Eyes: No scleral icterus.  Neck: Neck supple.  Cardiovascular: Regular rhythm and normal heart sounds.   Pulmonary/Chest: Effort normal and breath sounds normal. No respiratory distress.  Musculoskeletal:  L knee: Full range of motion, no effusion, no ecchymoses, Lachmans normal, Anterior & posterior drawer normal, McMurrays normal, Varus & valgus stress normal.  Patella freely mobile, Clarks compression test normal.  Good alignment. Distal neurovascular status is  intact.  Mild tenderness anterior knee near the patellar tendon.  She is tender to palpation on the anterior mid she in.  No ecchymoses.  Movement of the foot up and down to stress the tibialis anterior muscle does irritate the area as well.  Focal tests negative.   Neurological: She is alert and oriented to person, place, and time.  Skin: Skin is warm and dry.  Psychiatric: She has a normal mood and affect. Her speech is normal.    ED Course  Procedures (including critical care time) Labs Review Labs Reviewed - No data to display Imaging Review No results found.   MDM   1. Pain in shin    An x-ray was obtained and read by the radiologist as above.  Encourage rest, ice, compression with ACE bandage and/or a brace, and elevation of injured body part.  The role of anti-inflammatories is discussed with the patient and I placed her on tramadol.  No brace used.  If knee pain not improving, that should be further evaluated.  However as of today I think the main problem is a contusion of the anterior tibia.  Should avoid heavy exercises for the next week to avoid irritation of the tibialis anterior muscle.Lynn Forehand, MD 05/11/13 [phone]

## 2013-05-11 NOTE — ED Notes (Signed)
Pt c/o LT shin and knee x 0300 post fall at home. She has applied ice with no relief.

## 2015-06-08 IMAGING — CR DG TIBIA/FIBULA 2V*L*
4 series · 4 of 4 positions shown · non-contrast
Comparison: None.

CLINICAL DATA: Fell last night.  Anterior shin pain.

EXAM:
LEFT TIBIA AND FIBULA - 2 VIEW

[view not recorded (1 of 4)]
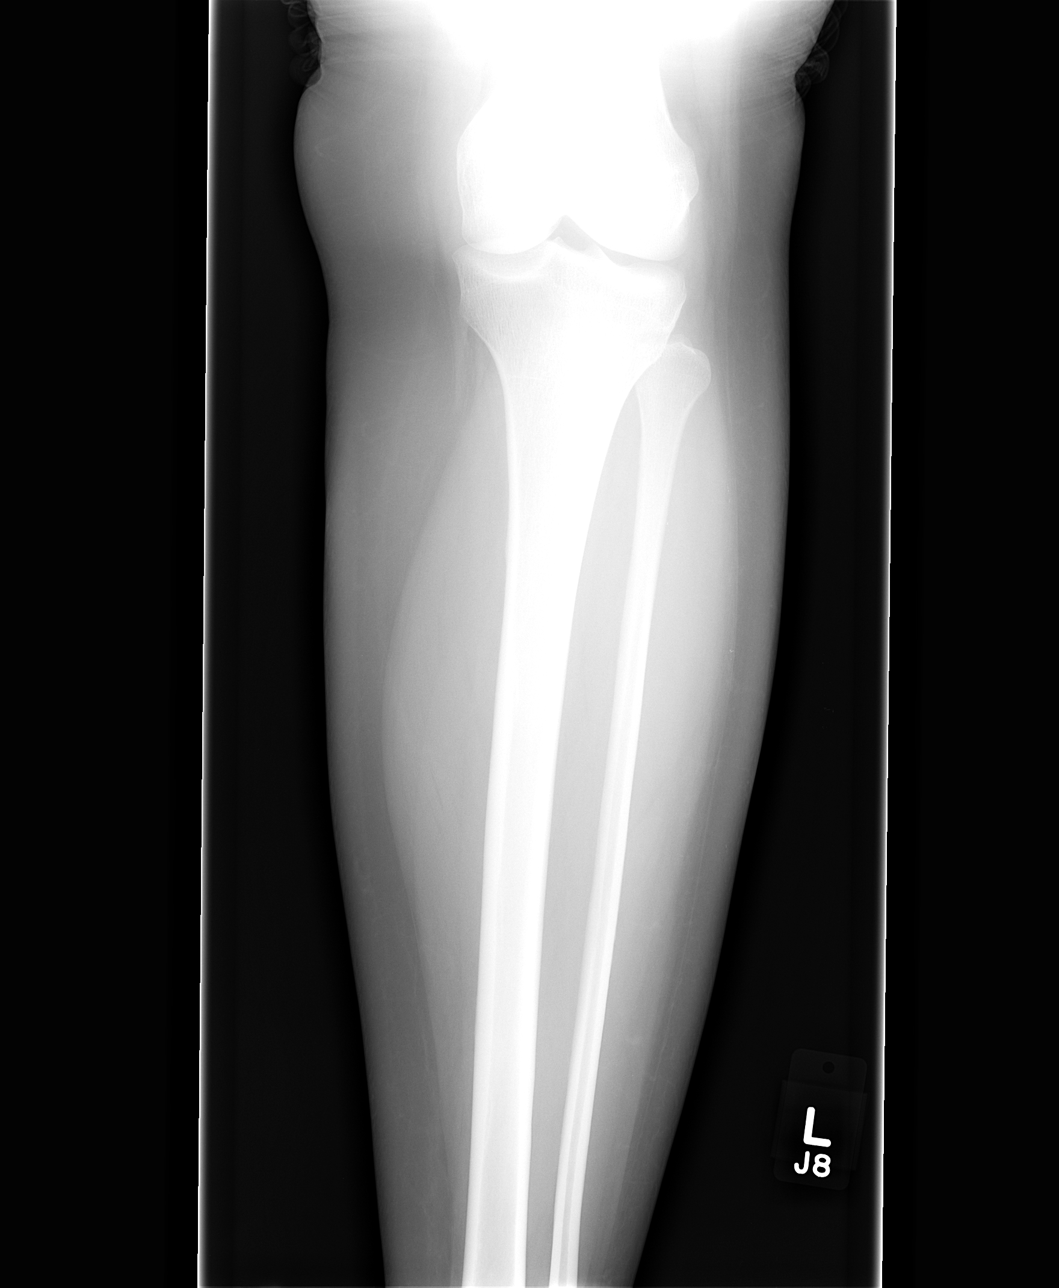

[view not recorded (2 of 4)]
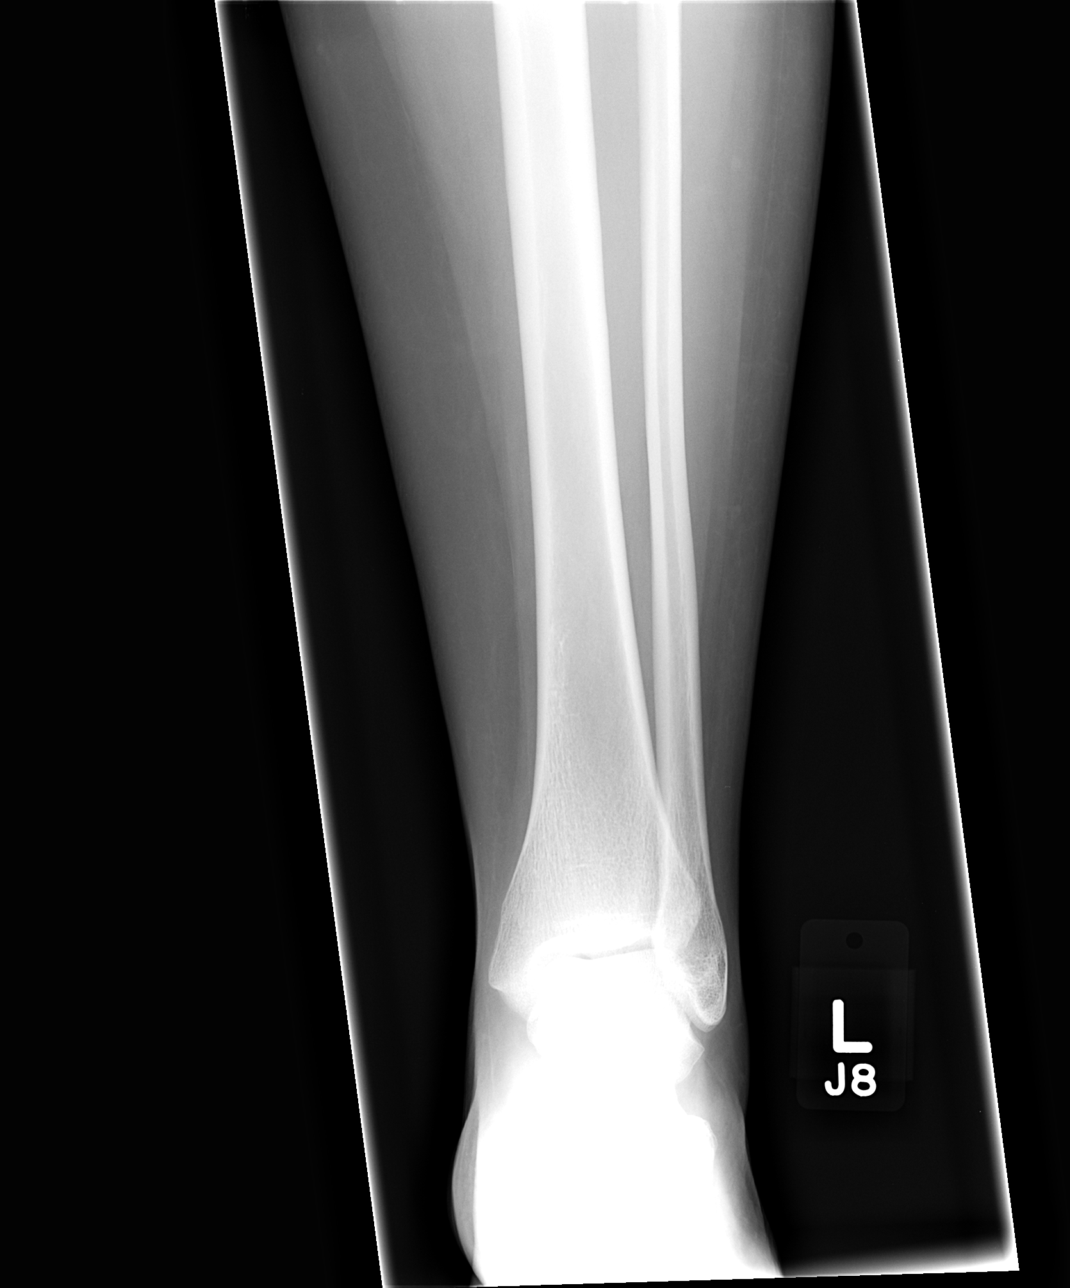

[view not recorded (3 of 4)]
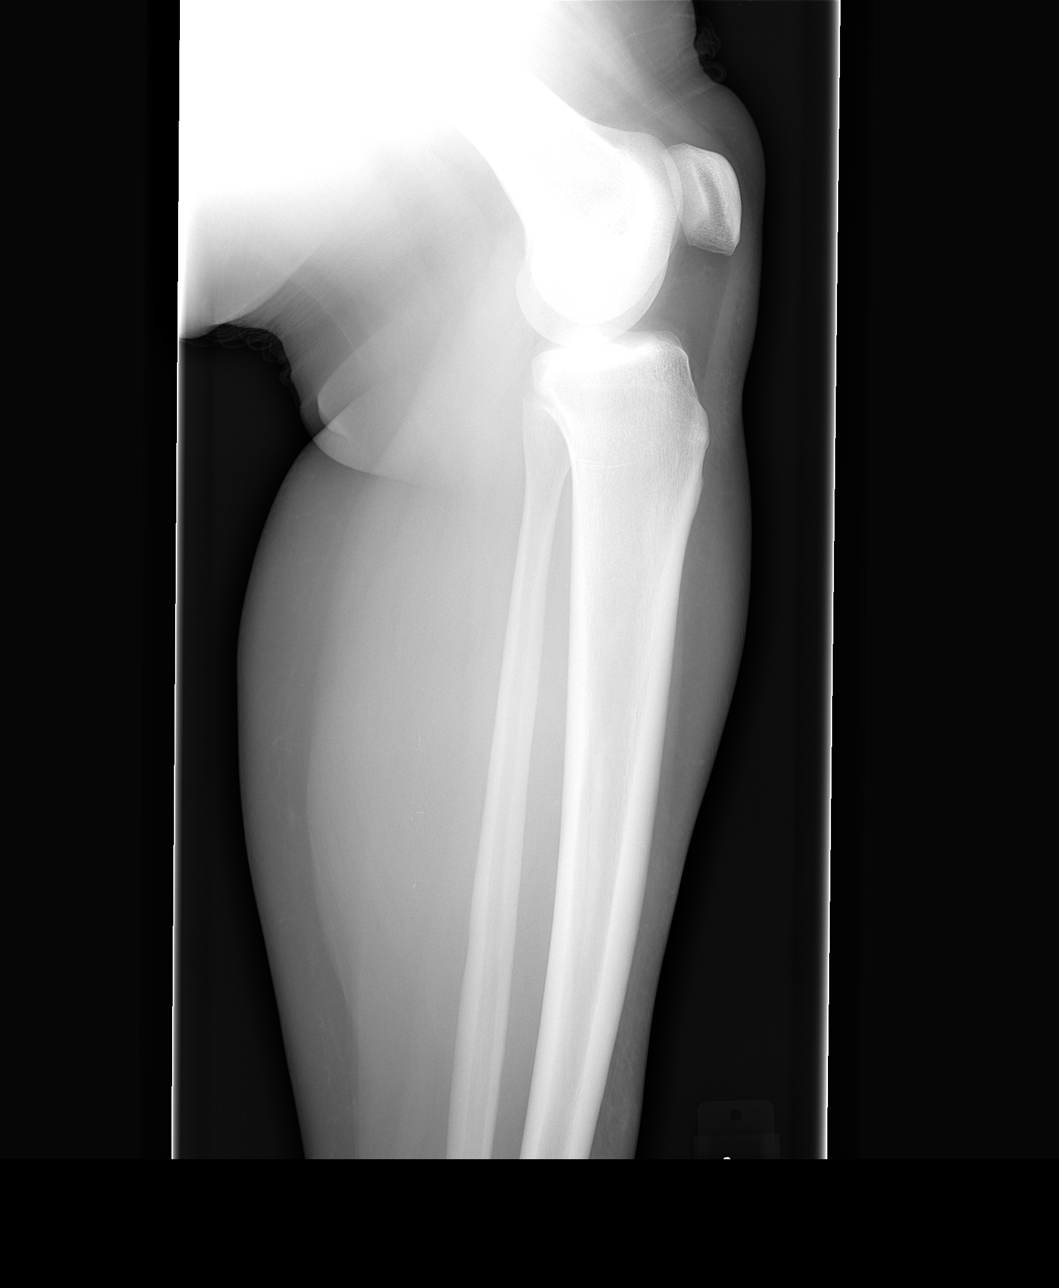

[view not recorded (4 of 4)]
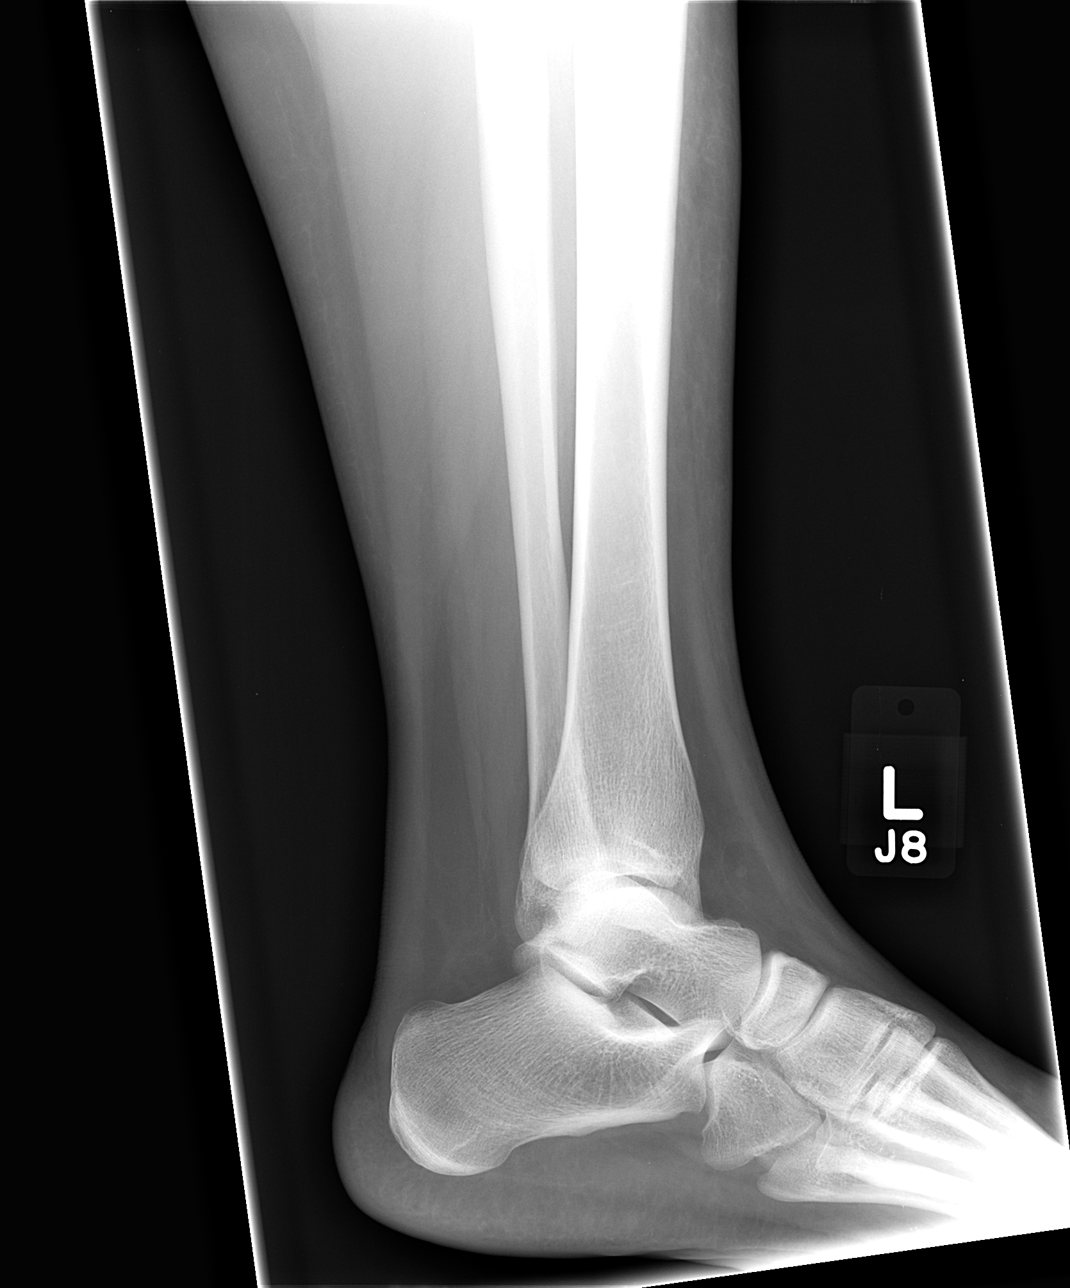

[4 of 4 positions shown; findings below may reference images not displayed]

FINDINGS: No fracture or bone lesion. The knee and ankle joints are normally
aligned there is mild anterior soft tissue edema.
IMPRESSION: No fracture.

## 2018-07-14 ENCOUNTER — Emergency Department (HOSPITAL_COMMUNITY)
Admission: EM | Admit: 2018-07-14 | Discharge: 2018-07-14 | Disposition: A | Discharge status: Home / Self Care | Payer: BLUE CROSS/BLUE SHIELD | Attending: Emergency Medicine | Admitting: Emergency Medicine

## 2018-07-14 ENCOUNTER — Other Ambulatory Visit: Payer: Self-pay

## 2018-07-14 ENCOUNTER — Encounter (HOSPITAL_COMMUNITY): Payer: Self-pay

## 2018-07-14 DIAGNOSIS — R531 Weakness: Secondary | ICD-10-CM | POA: Diagnosis not present

## 2018-07-14 DIAGNOSIS — M79605 Pain in left leg: Secondary | ICD-10-CM | POA: Insufficient documentation

## 2018-07-14 DIAGNOSIS — R350 Frequency of micturition: Secondary | ICD-10-CM

## 2018-07-14 DIAGNOSIS — R3915 Urgency of urination: Secondary | ICD-10-CM | POA: Diagnosis not present

## 2018-07-14 DIAGNOSIS — M79604 Pain in right leg: Secondary | ICD-10-CM | POA: Diagnosis present

## 2018-07-14 DIAGNOSIS — Z87891 Personal history of nicotine dependence: Secondary | ICD-10-CM | POA: Diagnosis not present

## 2018-07-14 DIAGNOSIS — Z79899 Other long term (current) drug therapy: Secondary | ICD-10-CM | POA: Diagnosis not present

## 2018-07-14 DIAGNOSIS — M791 Myalgia, unspecified site: Secondary | ICD-10-CM | POA: Insufficient documentation

## 2018-07-14 LAB — URINALYSIS, ROUTINE W REFLEX MICROSCOPIC
Bilirubin Urine: NEGATIVE
Glucose, UA: NEGATIVE mg/dL
Hgb urine dipstick: NEGATIVE
Ketones, ur: 5 mg/dL — AB
Leukocytes,Ua: NEGATIVE
Nitrite: NEGATIVE
Protein, ur: NEGATIVE mg/dL
Specific Gravity, Urine: 1.021 (ref 1.005–1.030)
pH: 6 (ref 5.0–8.0)

## 2018-07-14 LAB — POC URINE PREG, ED: Preg Test, Ur: NEGATIVE

## 2018-07-14 MED ORDER — DICLOFENAC SODIUM 1 % TD GEL
2.0000 g | Freq: Once | TRANSDERMAL | Status: DC
  Filled 2018-07-14: qty 100

## 2018-07-14 MED ORDER — NAPROXEN 500 MG PO TABS: 500.0000 mg | ORAL_TABLET | Freq: Two times a day (BID) | ORAL | 0 refills | Status: AC

## 2018-07-14 MED ORDER — KETOROLAC TROMETHAMINE 30 MG/ML IJ SOLN
30.0000 mg | Freq: Once | INTRAMUSCULAR | Status: AC
  Administered 2018-07-14: 30 mg via INTRAMUSCULAR
  Filled 2018-07-14: qty 1

## 2018-07-14 NOTE — ED Triage Notes (Addendum)
Patient c/o bilateral leg pain/burning x4-5 days. patient alsoo c/o body aches.  Patient reports that she was tested for Covid-19 x 4 days ago and has not received the results. 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

## 2018-07-14 NOTE — ED Provider Notes (Addendum)
[facility] DEPT Provider Note   CSN: [id number] Arrival date & time: 07/14/18  1115    History   Chief Complaint Chief Complaint  Patient presents with  . Leg Pain    HPI Lynn Sanchez is a 33 y.o. female.     HPI   Pt is a 33 y/o female with a h/o genital herpes, GERD who presents to the ED for eval of burning/sharp/tingling leg pain that began about 4 days ago. Pain is the anterior aspect of her bilat legs, and is worse to the bilateral knees.. Denies BLE swelling.  Denies any explicit weakness to the bilateral lower extremities but states that she has had to push herself up from a chair due to having pain in her knees.   Has some pressure to the lower back but no pain. States this is chronic and unchanged. No recent falls or trauma. Pt denies any numbness/tingling/weakness to the BLE. Denies saddle anesthesia. Denies loss of control of bowels or bladder. No urinary retention. No fevers. Denies a h/o IVDU. Denies a h/o CA or recent unintended weight loss.  States she has been having some urinary urgency and frequency. Denies dysuria.  States she has had body aches since last week. She also had a slight cough and a subjective fever. States she was tested for COVID and has not received results. No vomiting or diarrhea.  Past Medical History:  Diagnosis Date  . Genital herpes   . GERD (gastroesophageal reflux disease)    Zantac prn  . Mass of hip region 01/2012   left    There are no active problems to display for this patient.   Past Surgical History:  Procedure Laterality Date  . HIP SURGERY  2012   left  . LIPOSUCTION  02/23/2012   Procedure: LIPOSUCTION;  Surgeon: Cristine Polio, MD;  Location: [facility];  Service: Plastics;  Laterality: Left;  Marland Kitchen MASS EXCISION  02/23/2012   Procedure: EXCISION MASS;  Surgeon: Cristine Polio, MD;  Location: [facility];  Service: Plastics;  Laterality: Left;  EXCISION OF  LARGE MASS LEFT HIP LIPOSUCTION  ASSIST  . SCAR REVISION  02/23/2012   Procedure: SCAR REVISION;  Surgeon: Cristine Polio, MD;  Location: [facility];  Service: Plastics;  Laterality: Left;  SCAR REVISION LEFT HIP   . TONSILLECTOMY       OB History   No obstetric history on file.      Home Medications    Prior to Admission medications   Medication Sig Start Date End Date Taking? Authorizing Provider  Multiple Vitamins-Minerals (MULTIVITAMIN GUMMIES ADULTS) CHEW Chew 2 each by mouth daily.   Yes [provider]  valACYclovir (VALTREX) 1000 MG tablet Take 1,000 mg by mouth daily as needed (outbreaks).  04/14/18  Yes [provider]  HYDROcodone-acetaminophen (NORCO/VICODIN) 5-325 MG per tablet Take 1 tablet by mouth every 6 (six) hours as needed. Patient not taking: Reported on 07/14/2018 01/24/13   Varney Biles, MD  ibuprofen (ADVIL,MOTRIN) 600 MG tablet Take 1 tablet (600 mg total) by mouth every 6 (six) hours as needed. Patient not taking: Reported on 07/14/2018 01/24/13   Varney Biles, MD  naproxen (NAPROSYN) 500 MG tablet Take 1 tablet (500 mg total) by mouth 2 (two) times daily for 5 days. 07/14/18 07/19/18  Ranika Mcniel S, PA-C  traMADol (ULTRAM) 50 MG tablet Take 1 tablet (50 mg total) by mouth every 6 (six) hours as needed. Patient not taking: Reported  on 07/14/2018 05/11/13   Janeann Forehand, MD    Family History Family History  Problem Relation Age of Onset  . Hypertension Mother   . Diabetes Father   . Hypertension Father   . Diabetes Brother     Social History Social History   Tobacco Use  . Smoking status: Former Smoker    Years: 1.00  . Smokeless tobacco: Never Used  . Tobacco comment: a cigar 2 x/month  Substance Use Topics  . Alcohol use: Yes    Comment: alcohol 1-2 days a week.  . Drug use: No     Allergies   Doxycycline and Penicillins   Review of Systems Review of Systems  Constitutional: Negative for  fever.  Eyes: Negative for visual disturbance.  Respiratory: Positive for cough. Negative for shortness of breath.   Cardiovascular: Negative for chest pain.  Gastrointestinal: Negative for abdominal pain.  Genitourinary: Positive for frequency and urgency. Negative for pelvic pain.  Musculoskeletal: Negative for back pain.       BLE pain  Neurological: Negative for weakness and numbness.       BLE paresthesias     Physical Exam Updated Vital Signs BP 127/84   Pulse 69   Temp 98.3 F (36.8 C) (Oral)   Resp 19   Ht 5\' 8"  (1.727 m)   Wt 117.9 kg   LMP 07/06/2018   SpO2 100%   BMI 39.53 kg/m   Physical Exam Vitals signs and nursing note reviewed.  Constitutional:      General: She is not in acute distress.    Appearance: She is well-developed.  HENT:     Head: Normocephalic and atraumatic.  Eyes:     Conjunctiva/sclera: Conjunctivae normal.  Neck:     Musculoskeletal: Neck supple.  Cardiovascular:     Rate and Rhythm: Normal rate and regular rhythm.     Heart sounds: No murmur.  Pulmonary:     Effort: Pulmonary effort is normal. No respiratory distress.     Breath sounds: Normal breath sounds.  Abdominal:     Palpations: Abdomen is soft.     Tenderness: There is no abdominal tenderness.  Musculoskeletal:     Right lower leg: No edema.     Left lower leg: No edema.     Comments: Mild diffuse ttp to the bilat knees and shins that reproduces her pain. No erythema, swelling or warmth to the bilat knees. FROM at the hips/knees/feet.  Skin:    General: Skin is warm and dry.  Neurological:     Mental Status: She is alert.     Comments: Normal tone. 5/5 strength of BUE and BLE major muscle groups including strong and equal grip strength and dorsiflexion/plantar flexion Sensory: light touch normal in all extremities. DTRs: biceps and achilles 2+ symmetric b/l Gait: normal gait and balance. Walks with a limp       ED Treatments / Results  Labs (all labs ordered  are listed, but only abnormal results are displayed) Labs Reviewed  URINALYSIS, ROUTINE W REFLEX MICROSCOPIC - Abnormal; Notable for the following components:      Result Value   Ketones, ur 5 (*)    All other components within normal limits  POC URINE PREG, ED    EKG None  Radiology No results found.  Procedures Procedures (including critical care time)  Medications Ordered in ED Medications  ketorolac (TORADOL) 30 MG/ML injection 30 mg (30 mg Intramuscular Given 07/14/18 1521)     Initial Impression /  Assessment and Plan / ED Course  I have reviewed the triage vital signs and the nursing notes.  Pertinent labs & imaging results that were available during my care of the patient were reviewed by me and considered in my medical decision making (see chart for details).   Final Clinical Impressions(s) / ED Diagnoses   Final diagnoses:  Bilateral leg pain  Urinary frequency   Pt is a 33 y/o female who presents to the ED for eval of burning/sharp/tingling leg pain that began about 4 days ago. Pain is the anterior aspect of her bilat legs, and is worse to the bilateral knees. Denies BLE swelling.  Denies any explicit weakness to the bilateral lower extremities but states that she has had to push herself up from a chair due to having pain in her knees.  No back pain or red flag sxs of back pain. States she has been having some urinary urgency and frequency.  States she has had body aches since last week. She also had a slight cough and a subjective fever. States she was tested for COVID and has not received results. No vomiting or diarrhea.  On exam,  Mild diffuse ttp to the bilat knees and shins that reproduces her pain. No erythema, swelling or warmth to the bilat knees. FROM at the hips/knees/feet.  Normal tone. 5/5 strength of BLE major muscle groups including strong dorsiflexion/plantar flexion Sensory: light touch normal in all extremities. DTRs: biceps and achilles 2+ symmetric  b/l. Gait: normal gait and balance. Walks with a limp  Will check urine preg and ua given her urinary complaints. Will check post void residual.  Urine preg negative. UA negative for UTI. Post void residual is 0.  I have low suspicion for acute neurologic cause such as cauda equina, guillain barre, or other cause. I suspect that her sxs are msk in nature as she does have some tenderness to the bilat knees. She has no signs of septic arthritis. I will give rx for naproxen. I think she is appropriate for outpatient f/u with her pcp. I advised that if her sxs worsen or she experiences new sxs then she needs to return to the ED.   ED Discharge Orders         Ordered    naproxen (NAPROSYN) 500 MG tablet  2 times daily     07/14/18 [id number] Sydne Krahl S, PA-C 07/14/18 1526    Rodney Booze, PA-C 07/14/18 1535    Gareth Morgan, MD 07/14/18 2159

## 2018-07-14 NOTE — Discharge Instructions (Addendum)
You may alternate taking Tylenol and Naproxen as needed for pain control. You may take Naproxen twice daily as directed on your discharge paperwork and you may take  [phone] mg of Tylenol every 6 hours. Do not exceed 4000 mg of Tylenol daily as this can lead to liver damage. Also, make sure to take Naproxen with meals as it can cause an upset stomach. Do not take other NSAIDs while taking Naproxen such as (Aleve, Ibuprofen, Aspirin, Celebrex, etc) and do not take more than the prescribed dose as this can lead to ulcers and bleeding in your GI tract. You may use warm and cold compresses to help with your symptoms.   Please follow up with your primary doctor within the next 7-10 days for re-evaluation and further treatment of your symptoms.   Please return to the ER sooner if you have any new or worsening symptoms.

## 2018-07-14 NOTE — ED Notes (Signed)
Bed: QD82 Expected date:  Expected time:  Means of arrival:  Comments: TO CLEAN at 12:45 pm

## 2018-09-08 ENCOUNTER — Other Ambulatory Visit: Payer: Self-pay

## 2018-09-08 DIAGNOSIS — R21 Rash and other nonspecific skin eruption: Secondary | ICD-10-CM | POA: Diagnosis present

## 2018-09-08 DIAGNOSIS — Z87891 Personal history of nicotine dependence: Secondary | ICD-10-CM | POA: Insufficient documentation

## 2018-09-08 DIAGNOSIS — Z79899 Other long term (current) drug therapy: Secondary | ICD-10-CM | POA: Insufficient documentation

## 2018-09-08 DIAGNOSIS — L509 Urticaria, unspecified: Secondary | ICD-10-CM | POA: Insufficient documentation

## 2018-09-08 DIAGNOSIS — R030 Elevated blood-pressure reading, without diagnosis of hypertension: Secondary | ICD-10-CM | POA: Insufficient documentation

## 2018-09-09 ENCOUNTER — Emergency Department (HOSPITAL_COMMUNITY)
Admission: EM | Admit: 2018-09-09 | Discharge: 2018-09-09 | Disposition: A | Discharge status: Home / Self Care | Payer: BC Managed Care – PPO | Attending: Emergency Medicine | Admitting: Emergency Medicine

## 2018-09-09 ENCOUNTER — Other Ambulatory Visit: Payer: Self-pay

## 2018-09-09 ENCOUNTER — Encounter (HOSPITAL_COMMUNITY): Payer: Self-pay

## 2018-09-09 DIAGNOSIS — L509 Urticaria, unspecified: Secondary | ICD-10-CM

## 2018-09-09 MED ORDER — FAMOTIDINE 20 MG PO TABS: 20.0000 mg | ORAL_TABLET | Freq: Two times a day (BID) | ORAL | 0 refills | Status: AC

## 2018-09-09 MED ORDER — PREDNISONE 10 MG (21) PO TBPK: ORAL_TABLET | Freq: Every day | ORAL | 0 refills | Status: AC

## 2018-09-09 MED ORDER — PREDNISONE 20 MG PO TABS
60.0000 mg | ORAL_TABLET | Freq: Once | ORAL | Status: AC
  Administered 2018-09-09: 02:00:00 60 mg via ORAL
  Filled 2018-09-09: qty 3

## 2018-09-09 MED ORDER — DIPHENHYDRAMINE HCL 25 MG PO TABS: 25.0000 mg | ORAL_TABLET | Freq: Four times a day (QID) | ORAL | 0 refills | Status: AC | PRN

## 2018-09-09 MED ORDER — FAMOTIDINE 20 MG PO TABS
20.0000 mg | ORAL_TABLET | Freq: Once | ORAL | Status: AC
  Administered 2018-09-09: 20 mg via ORAL
  Filled 2018-09-09: qty 1

## 2018-09-09 MED ORDER — FAMOTIDINE 20 MG PO TABS: 40.0000 mg | ORAL_TABLET | Freq: Once | ORAL | Status: DC

## 2018-09-09 NOTE — ED Provider Notes (Signed)
[facility] DEPT Provider Note   CSN: [id number] Arrival date & time: 09/08/18  2320     History   Chief Complaint Chief Complaint  Patient presents with  . Rash    HPI Lynn Sanchez is a 33 y.o. female with a hx of GERD & general herpes who presents to the ED w/ complaints of rash that began after she awoke from a nap @ 22:30 this evening. Patient reports pruritic red rash to extremities x 4 & to the trunk. No alleviating/aggravating factors. No intervention PTA. No hx of similar rashes. No new products/environments/meds that she can recall today. She denies tic exposures or recent activity in wooded areas. Denies fever, chills, nausea, vomiting, abdominal pain, dyspnea, facial swelling, trouble swallowing, wheezing, or sensation of throat closing. Denies chance of pregnancy.      HPI  Past Medical History:  Diagnosis Date  . Genital herpes   . GERD (gastroesophageal reflux disease)    Zantac prn  . Mass of hip region 01/2012   left    There are no active problems to display for this patient.   Past Surgical History:  Procedure Laterality Date  . HIP SURGERY  2012   left  . LIPOSUCTION  02/23/2012   Procedure: LIPOSUCTION;  Surgeon: Cristine Polio, MD;  Location: [facility];  Service: Plastics;  Laterality: Left;  Marland Kitchen MASS EXCISION  02/23/2012   Procedure: EXCISION MASS;  Surgeon: Cristine Polio, MD;  Location: [facility];  Service: Plastics;  Laterality: Left;  EXCISION OF LARGE MASS LEFT HIP LIPOSUCTION  ASSIST  . SCAR REVISION  02/23/2012   Procedure: SCAR REVISION;  Surgeon: Cristine Polio, MD;  Location: [facility];  Service: Plastics;  Laterality: Left;  SCAR REVISION LEFT HIP   . TONSILLECTOMY       OB History   No obstetric history on file.      Home Medications    Prior to Admission medications   Medication Sig Start Date End Date Taking? Authorizing Provider   HYDROcodone-acetaminophen (NORCO/VICODIN) 5-325 MG per tablet Take 1 tablet by mouth every 6 (six) hours as needed. Patient not taking: Reported on 07/14/2018 01/24/13   Varney Biles, MD  ibuprofen (ADVIL,MOTRIN) 600 MG tablet Take 1 tablet (600 mg total) by mouth every 6 (six) hours as needed. Patient not taking: Reported on 07/14/2018 01/24/13   Varney Biles, MD  Multiple Vitamins-Minerals (MULTIVITAMIN GUMMIES ADULTS) CHEW Chew 2 each by mouth daily.    [provider]  traMADol (ULTRAM) 50 MG tablet Take 1 tablet (50 mg total) by mouth every 6 (six) hours as needed. Patient not taking: Reported on 07/14/2018 05/11/13   Janeann Forehand, MD  valACYclovir (VALTREX) 1000 MG tablet Take 1,000 mg by mouth daily as needed (outbreaks).  04/14/18   [provider]    Family History Family History  Problem Relation Age of Onset  . Hypertension Mother   . Diabetes Father   . Hypertension Father   . Diabetes Brother     Social History Social History   Tobacco Use  . Smoking status: Former Smoker    Years: 1.00  . Smokeless tobacco: Never Used  . Tobacco comment: a cigar 2 x/month  Substance Use Topics  . Alcohol use: Yes    Comment: alcohol 1-2 days a week.  . Drug use: No     Allergies   Doxycycline and Penicillins   Review of Systems Review of Systems  Constitutional:  Negative for chills and fever.  HENT: Negative for sore throat, trouble swallowing and voice change.   Respiratory: Negative for cough, choking, chest tightness, shortness of breath and wheezing.   Gastrointestinal: Negative for abdominal pain, nausea and vomiting.  Skin: Positive for rash.  Neurological: Negative for syncope.     Physical Exam Updated Vital Signs BP (!) 126/93 (BP Location: Right Arm)   Pulse 77   Temp 97.7 F (36.5 C) (Oral)   Resp 16   SpO2 100%   Physical Exam Vitals signs and nursing note reviewed.  Constitutional:      General: She is not in acute  distress.    Appearance: She is well-developed.  HENT:     Head: Normocephalic and atraumatic.     Comments: No facial swelling, no angioedema.     Mouth/Throat:     Comments: Airway is patent. No intra-oral edema. Posterior oropharynx is symmetric appearing. Patient tolerating own secretions without difficulty. No trismus. No drooling. No hot potato voice. No swelling beneath the tongue, submandibular compartment is soft. Braces to lower gumline.   Eyes:     General:        Right eye: No discharge.        Left eye: No discharge.     Conjunctiva/sclera: Conjunctivae normal.  Cardiovascular:     Rate and Rhythm: Normal rate and regular rhythm.     Pulses: Normal pulses.     Heart sounds: Normal heart sounds.  Pulmonary:     Effort: Pulmonary effort is normal. No respiratory distress.     Breath sounds: Normal breath sounds. No stridor. No wheezing, rhonchi or rales.  Abdominal:     General: There is no distension.     Palpations: Abdomen is soft.     Tenderness: There is no abdominal tenderness.  Skin:    Findings: Rash (extremities x 4 & to anterior/posterior trunk- no facial or palm/sole involvement) present. Rash is urticarial.  Neurological:     Mental Status: She is alert.     Comments: Clear speech.   Psychiatric:        Behavior: Behavior normal.        Thought Content: Thought content normal.     ED Treatments / Results  Labs (all labs ordered are listed, but only abnormal results are displayed) Labs Reviewed - No data to display  EKG None  Radiology No results found.  Procedures Procedures (including critical care time)  Medications Ordered in ED Medications  predniSONE (DELTASONE) tablet 60 mg (has no administration in time range)  famotidine (PEPCID) tablet 20 mg (has no administration in time range)     Initial Impression / Assessment and Plan / ED Course  I have reviewed the triage vital signs and the nursing notes.  Pertinent labs & imaging  results that were available during my care of the patient were reviewed by me and considered in my medical decision making (see chart for details).   Patient presents to the ED w/ urticarial rash.  She is nontoxic appearing, in no apparent distress, vitals WNL with the exception of mildly elevated diastolic BP- doubt HTN emergency. Exam w/ urticarial rash of unclear etiology.  Patient denies any difficulty breathing or swallowing, she has a patent airway without stridor and is handling secretions without difficulty; no angioedema. No blisters, no pustules, no warmth, no draining sinus tracts, no superficial abscesses, no bullous impetigo, no vesicles, no desquamation, no target lesions with dusky purpura or a central bulla. Not  tender to touch. No concern for superimposed infection. No concern for SJS, TEN, TSS, tick borne illness, syphilis or other life-threatening condition.   Will tx w/ steroids & anti-histamines, benadryl not given in the ED due to driving & avoiding drowsiness. Will discharge home with short course of steroids, pepcid and benadryl w/ PCP follow up and strict return precautions. I discussed results, treatment plan, need for follow-up, and return precautions with the patient. Provided opportunity for questions, patient confirmed understanding and is in agreement with plan.    Final Clinical Impressions(s) / ED Diagnoses   Final diagnoses:  Urticaria    ED Discharge Orders         Ordered    predniSONE (STERAPRED UNI-PAK 21 TAB) 10 MG (21) TBPK tablet  Daily     09/09/18 0131    famotidine (PEPCID) 20 MG tablet  2 times daily     09/09/18 0131    diphenhydrAMINE (BENADRYL) 25 MG tablet  Every 6 hours PRN     09/09/18 0131           Amaryllis Dyke, PA-C 59/45/85 [id number]    Delora Fuel, MD 44/62/86 [phone]

## 2018-09-09 NOTE — ED Notes (Signed)
Pt declined dc vitals.

## 2018-09-09 NOTE — ED Triage Notes (Addendum)
Pt reports itchy red rash after waking up from a nap. No known allergies. No new detergent or soap.

## 2018-09-09 NOTE — Discharge Instructions (Signed)
You were seen in the ER today for hives.  We have given you steroids, benadryl, & pepcid.  Please take medications as prescribed.  We have prescribed you new medication(s) today. Discuss the medications prescribed today with your pharmacist as they can have adverse effects and interactions with your other medicines including over the counter and prescribed medications. Seek medical evaluation if you start to experience new or abnormal symptoms after taking one of these medicines, seek care immediately if you start to experience difficulty breathing, feeling of your throat closing, facial swelling, or rash as these could be indications of a more serious allergic reaction  We would like you to follow up closely with primary care within 2 days.   Return to the Er for new or worsening symptoms including but not limited to trouble breathing, facial swelling, feeling of your throat swelling/closing, vomiting, wheezing, or any other concerns.

## 2019-04-23 ENCOUNTER — Ambulatory Visit: Payer: Self-pay | Attending: Internal Medicine

## 2019-04-23 DIAGNOSIS — Z23 Encounter for immunization: Secondary | ICD-10-CM | POA: Insufficient documentation

## 2019-04-23 NOTE — Progress Notes (Signed)
   Covid-19 Vaccination Clinic  Name:  Tilla Bridson    MRN: [id number] DOB: [birth year]  04/23/2019  Ms. Schwartzenberge was observed post Covid-19 immunization for 15 minutes without incident. She was provided with Vaccine Information Sheet and instruction to access the V-Safe system.   Ms. [place] was instructed to call 911 with any severe reactions post vaccine: Marland Kitchen Difficulty breathing  . Swelling of face and throat  . A fast heartbeat  . A bad rash all over body  . Dizziness and weakness   Immunizations Administered    Name Date Dose VIS Date Route   Pfizer COVID-19 Vaccine 04/23/2019 12:06 PM 0.3 mL 01/28/2019 Intramuscular   Manufacturer: [place]   Lot: [id number]   [place]: [id number]

## 2019-05-14 ENCOUNTER — Ambulatory Visit: Payer: Self-pay | Attending: Internal Medicine

## 2019-05-14 DIAGNOSIS — Z23 Encounter for immunization: Secondary | ICD-10-CM

## 2019-05-14 NOTE — Progress Notes (Signed)
   Covid-19 Vaccination Clinic  Name:  Chrystel Degeus    MRN: [id number] DOB: 1985-04-22  05/14/2019  Ms. Quickle was observed post Covid-19 immunization for 67minutes without incident. She was provided with Vaccine Information Sheet and instruction to access the V-Safe system.   Ms. Neveu was instructed to call 911 with any severe reactions post vaccine: Marland Kitchen Difficulty breathing  . Swelling of face and throat  . A fast heartbeat  . A bad rash all over body  . Dizziness and weakness   Immunizations Administered    Name Date Dose VIS Date Route   Pfizer COVID-19 Vaccine 05/14/2019  8:23 AM 0.3 mL 01/28/2019 Intramuscular   Manufacturer: [place]   Lot: [id number]   [place]: [id number]

## 2019-05-24 ENCOUNTER — Ambulatory Visit: Payer: Self-pay

## 2020-01-04 ENCOUNTER — Encounter (HOSPITAL_COMMUNITY): Payer: Self-pay | Admitting: *Deleted

## 2020-01-04 ENCOUNTER — Emergency Department (HOSPITAL_COMMUNITY)
Admission: EM | Admit: 2020-01-04 | Discharge: 2020-01-04 | Disposition: A | Discharge status: Home / Self Care | Payer: PRIVATE HEALTH INSURANCE | Attending: Emergency Medicine | Admitting: Emergency Medicine

## 2020-01-04 ENCOUNTER — Other Ambulatory Visit: Payer: Self-pay

## 2020-01-04 ENCOUNTER — Emergency Department (HOSPITAL_COMMUNITY): Payer: PRIVATE HEALTH INSURANCE

## 2020-01-04 DIAGNOSIS — Z87891 Personal history of nicotine dependence: Secondary | ICD-10-CM | POA: Insufficient documentation

## 2020-01-04 DIAGNOSIS — R079 Chest pain, unspecified: Secondary | ICD-10-CM

## 2020-01-04 DIAGNOSIS — R072 Precordial pain: Secondary | ICD-10-CM | POA: Diagnosis not present

## 2020-01-04 LAB — D-DIMER, QUANTITATIVE: D-Dimer, Quant: 0.33 ug/mL-FEU (ref 0.00–0.50)

## 2020-01-04 LAB — CBC
HCT: 43.7 % (ref 36.0–46.0)
Hemoglobin: 14.2 g/dL (ref 12.0–15.0)
MCH: 28.7 pg (ref 26.0–34.0)
MCHC: 32.5 g/dL (ref 30.0–36.0)
MCV: 88.3 fL (ref 80.0–100.0)
Platelets: 264 10*3/uL (ref 150–400)
RBC: 4.95 MIL/uL (ref 3.87–5.11)
RDW: 14.2 % (ref 11.5–15.5)
WBC: 8.2 10*3/uL (ref 4.0–10.5)
nRBC: 0 % (ref 0.0–0.2)

## 2020-01-04 LAB — TROPONIN I (HIGH SENSITIVITY): Troponin I (High Sensitivity): <2 ng/L (ref <18)

## 2020-01-04 LAB — BASIC METABOLIC PANEL
Anion gap: 6 (ref 5–15)
BUN: 12 mg/dL (ref 6–20)
CO2: 30 mmol/L (ref 22–32)
Calcium: 9.4 mg/dL (ref 8.9–10.3)
Chloride: 101 mmol/L (ref 98–111)
Creatinine, Ser: 0.91 mg/dL (ref 0.44–1.00)
GFR, Estimated: >60 mL/min (ref >60)
Glucose, Bld: 89 mg/dL (ref 70–99)
Potassium: 3.7 mmol/L (ref 3.5–5.1)
Sodium: 137 mmol/L (ref 135–145)

## 2020-01-04 LAB — I-STAT BETA HCG BLOOD, ED (MC, WL, AP ONLY): I-stat hCG, quantitative: <5 m[IU]/mL (ref <5)

## 2020-01-04 MED ORDER — ALUM & MAG HYDROXIDE-SIMETH 200-200-20 MG/5ML PO SUSP
30.0000 mL | Freq: Once | ORAL | Status: AC
  Administered 2020-01-04: 30 mL via ORAL
  Filled 2020-01-04: qty 30

## 2020-01-04 MED ORDER — LIDOCAINE VISCOUS HCL 2 % MT SOLN
15.0000 mL | Freq: Once | OROMUCOSAL | Status: AC
  Administered 2020-01-04: 15 mL via ORAL
  Filled 2020-01-04: qty 15

## 2020-01-04 NOTE — Discharge Instructions (Addendum)
You were seen in the emergency department for evaluation of chest pain.  You had an EKG and chest x-ray and lab work that did not show any serious findings.  This may be muscular and you can try some ibuprofen.  Follow-up with your doctor.  Return to the emergency department for any worsening or concerning symptoms.

## 2020-01-04 NOTE — ED Triage Notes (Signed)
Pt reports tightness in chest since 2AM today. She feels like her heart is skipping a beat. Reports pain with deep breath.

## 2020-01-04 NOTE — ED Provider Notes (Signed)
[facility] DEPT Provider Note   CSN: [id number] Arrival date & time: 01/04/20  0272     History Chief Complaint  Patient presents with  . Chest Pain    Lynn Sanchez is a 34 y.o. female.  She has no significant past medical history.  Complaining of substernal chest pain and tightness starting at 2 AM this morning.  Worse with deep breath.  Also feels like her heart rate is elevated and she is skipping a beat.  Denies history of same.  Denies heartburn symptoms.  No cough.  No abdominal pain.  No radiation of her pain.  Tried meditation without improvement.  The history is provided by the patient.  Chest Pain Pain location:  Substernal area Pain quality: tightness   Pain radiates to:  Does not radiate Pain severity:  Moderate Onset quality:  Gradual Duration:  7 hours Timing:  Constant Progression:  Unchanged Chronicity:  New Context: at rest   Relieved by:  Nothing Worsened by:  Deep breathing Ineffective treatments:  None tried Associated symptoms: no abdominal pain, no back pain, no cough, no diaphoresis, no fever, no headache, no heartburn, no lower extremity edema, no nausea, no shortness of breath and no vomiting   Risk factors: no smoking        Past Medical History:  Diagnosis Date  . Genital herpes   . GERD (gastroesophageal reflux disease)    Zantac prn  . Mass of hip region 01/2012   left    There are no problems to display for this patient.   Past Surgical History:  Procedure Laterality Date  . HIP SURGERY  2012   left  . LIPOSUCTION  02/23/2012   Procedure: LIPOSUCTION;  Surgeon: Cristine Polio, MD;  Location: [facility];  Service: Plastics;  Laterality: Left;  Marland Kitchen MASS EXCISION  02/23/2012   Procedure: EXCISION MASS;  Surgeon: Cristine Polio, MD;  Location: [facility];  Service: Plastics;  Laterality: Left;  EXCISION OF LARGE MASS LEFT HIP LIPOSUCTION  ASSIST  . SCAR REVISION  02/23/2012    Procedure: SCAR REVISION;  Surgeon: Cristine Polio, MD;  Location: [facility];  Service: Plastics;  Laterality: Left;  SCAR REVISION LEFT HIP   . TONSILLECTOMY       OB History   No obstetric history on file.     Family History  Problem Relation Age of Onset  . Hypertension Mother   . Diabetes Father   . Hypertension Father   . Diabetes Brother     Social History   Tobacco Use  . Smoking status: Former Smoker    Years: 1.00  . Smokeless tobacco: Never Used  . Tobacco comment: a cigar 2 x/month  Vaping Use  . Vaping Use: Never used  Substance Use Topics  . Alcohol use: Yes    Comment: alcohol 1-2 days a week.  . Drug use: No    Home Medications Prior to Admission medications   Medication Sig Start Date End Date Taking? Authorizing Provider  diphenhydrAMINE (BENADRYL) 25 MG tablet Take 1 tablet (25 mg total) by mouth every 6 (six) hours as needed. 09/09/18   Petrucelli, Samantha R, PA-C  famotidine (PEPCID) 20 MG tablet Take 1 tablet (20 mg total) by mouth 2 (two) times daily. 09/09/18   Petrucelli, Samantha R, PA-C  HYDROcodone-acetaminophen (NORCO/VICODIN) 5-325 MG per tablet Take 1 tablet by mouth every 6 (six) hours as needed. Patient not taking: Reported on 07/14/2018 01/24/13  Varney Biles, MD  ibuprofen (ADVIL,MOTRIN) 600 MG tablet Take 1 tablet (600 mg total) by mouth every 6 (six) hours as needed. Patient not taking: Reported on 07/14/2018 01/24/13   Varney Biles, MD  Multiple Vitamins-Minerals (MULTIVITAMIN GUMMIES ADULTS) CHEW Chew 2 each by mouth daily.    [provider]  predniSONE (STERAPRED UNI-PAK 21 TAB) 10 MG (21) TBPK tablet Take by mouth daily. 6, 5, 4, 3, 2, 1 take as written 09/09/18   Petrucelli, Samantha R, PA-C  traMADol (ULTRAM) 50 MG tablet Take 1 tablet (50 mg total) by mouth every 6 (six) hours as needed. Patient not taking: Reported on 07/14/2018 05/11/13   Janeann Forehand, MD  valACYclovir (VALTREX) 1000 MG  tablet Take 1,000 mg by mouth daily as needed (outbreaks).  04/14/18   [provider]    Allergies    Doxycycline and Penicillins  Review of Systems   Review of Systems  Constitutional: Negative for diaphoresis and fever.  HENT: Negative for sore throat.   Eyes: Negative for visual disturbance.  Respiratory: Negative for cough and shortness of breath.   Cardiovascular: Positive for chest pain.  Gastrointestinal: Negative for abdominal pain, heartburn, nausea and vomiting.  Genitourinary: Negative for dysuria.  Musculoskeletal: Negative for back pain.  Skin: Negative for rash.  Neurological: Negative for headaches.    Physical Exam Updated Vital Signs BP (!) 135/100   Pulse 86   Temp 97.9 F (36.6 C) (Oral)   Resp 18   LMP 12/19/2019   SpO2 99%   Physical Exam Vitals and nursing note reviewed.  Constitutional:      General: She is not in acute distress.    Appearance: Normal appearance. She is well-developed.  HENT:     Head: Normocephalic and atraumatic.  Eyes:     Conjunctiva/sclera: Conjunctivae normal.  Cardiovascular:     Rate and Rhythm: Normal rate and regular rhythm.     Heart sounds: No murmur heard.   Pulmonary:     Effort: Pulmonary effort is normal. No respiratory distress.     Breath sounds: Normal breath sounds.  Abdominal:     Palpations: Abdomen is soft.     Tenderness: There is no abdominal tenderness.  Musculoskeletal:        General: Normal range of motion.     Cervical back: Neck supple.     Right lower leg: No edema.     Left lower leg: No edema.  Skin:    General: Skin is warm and dry.     Capillary Refill: Capillary refill takes less than 2 seconds.  Neurological:     General: No focal deficit present.     Mental Status: She is alert.     ED Results / Procedures / Treatments   Labs (all labs ordered are listed, but only abnormal results are displayed) Labs Reviewed  BASIC METABOLIC PANEL  CBC  D-DIMER, QUANTITATIVE  (NOT AT [facility])  I-STAT BETA HCG BLOOD, ED ([facility], [facility], [facility] ONLY)  TROPONIN I (HIGH SENSITIVITY)    EKG EKG Interpretation  Date/Time:  Wednesday January 04 2020 08:46:18 EST Ventricular Rate:  79 PR Interval:    QRS Duration: 96 QT Interval:  377 QTC Calculation: 433 R Axis:   65 Text Interpretation: Sinus rhythm 12 Lead; Mason-Likar No old tracing to compare Confirmed by Aletta Edouard [phone]) on 01/04/2020 8:48:02 AM   Radiology DG Chest 2 View  Result Date: 01/04/2020 CLINICAL DATA:  Chest pain and cardiac palpitations EXAM: CHEST -  2 VIEW COMPARISON:  None. FINDINGS: Lungs are clear. Heart size and pulmonary vascularity are normal. No adenopathy. No bone lesions. No pneumothorax. IMPRESSION: Lungs clear.  Cardiac silhouette normal. Electronically Signed   By: Lowella Grip III M.D.   On: 01/04/2020 09:09    Procedures Procedures (including critical care time)  Medications Ordered in ED Medications  alum & mag hydroxide-simeth (MAALOX/MYLANTA) 200-200-20 MG/5ML suspension 30 mL (30 mLs Oral Given 01/04/20 1042)    And  lidocaine (XYLOCAINE) 2 % viscous mouth solution 15 mL (15 mLs Oral Given 01/04/20 1042)    ED Course  I have reviewed the triage vital signs and the nursing notes.  Pertinent labs & imaging results that were available during my care of the patient were reviewed by me and considered in my medical decision making (see chart for details).  Clinical Course as of Jan 03 1905  Wed Jan 04, 2020  0940 Chest x-ray showing normal heart size clear lungs.   [MB]  1048 GI cocktail with no change in patient's symptoms.  She did tell the nurse that she had been exercising and doing butterflies so musculoskeletal may be because of her symptoms.  She is comfortable plan for trial of NSAIDs and follow-up PCP.  Return instructions discussed   [MB]    Clinical Course User Index [MB] Hayden Rasmussen, MD   MDM Rules/Calculators/A&P                         This  patient complains of left-sided chest pain; this involves an extensive number of treatment Options and is a complaint that carries with it a high risk of complications and Morbidity. The differential includes ACS, pneumonia, pneumothorax, PE, GERD, muscular  I ordered, reviewed and interpreted labs, which included CBC with normal white count normal hemoglobin, chemistries normal with normal renal function, troponin unremarkable, D-dimer negative, pregnancy test negative I ordered medication GI cocktail with minimal change in his symptoms I ordered imaging studies which included chest x-ray and I independently    visualized and interpreted imaging which showed no acute findings Previous records obtained and reviewed in epic, no recent visits  After the interventions stated above, I reevaluated the patient and found patient to be minimally symptomatic.  I reviewed her work-up with her and she is comfortable with plan for outpatient follow-up with her PCP.  Her blood pressures are elevated and recommended that she review this with her primary care doctor.  Return instructions discussed.   Final Clinical Impression(s) / ED Diagnoses Final diagnoses:  Nonspecific chest pain    Rx / DC Orders ED Discharge Orders    None       Hayden Rasmussen, MD 01/04/20 Einar Crow

## 2020-08-09 ENCOUNTER — Emergency Department
Admission: EM | Admit: 2020-08-09 | Discharge: 2020-08-09 | Disposition: A | Discharge status: Home / Self Care | Payer: Self-pay | Attending: Emergency Medicine | Admitting: Emergency Medicine

## 2020-08-09 DIAGNOSIS — H5789 Other specified disorders of eye and adnexa: Secondary | ICD-10-CM | POA: Insufficient documentation

## 2020-08-09 HISTORY — DX: Other allergy status, other than to drugs and biological substances: Z91.09

## 2020-08-09 MED ORDER — CIPROFLOXACIN HCL 0.3 % OP SOLN
1.0000 [drp] | OPHTHALMIC | 0 refills | Status: DC
Start: 2020-08-09 — End: 2023-03-19

## 2020-08-09 NOTE — Discharge Instructions (Signed)
Eye Irritation   Insert Cipro drops as directed. Avoid contact lens use until irritation/discharge resolves.  Return for any concerning symptoms.  Follow up with ophthalmology referral if symptoms persist.  Good hand hygiene.    You have been seen for eye irritation.     Eye irritation means there is discomfort in one or both eyes. This may include pain, tearing or itching. There may be redness. Some causes of eye irritation are infections, allergies, foreign bodies (something in your eye). Scratches to the covering of the eye (corneal abrasion) can also be the cause. The doctor doesn't think any of these are the reason for your symptoms.     Other causes of eye irritation are unusually dry eyes, chemicals (like perfumes or cleaners), or eyelid or eyelash abnormalities. Usually, eye irritation will go away with time. Some people may need to be treated by a specialist.     Some things to help are:  Avoid rubbing your eye.  Cool compresses (like a cold, wet washcloth). This is to relieve itching.  Warm compresses (a warm, wet washcloth). This is to loosen crusting along the eyelashes.  Lubricating eye drops (like artificial tears). This provides moisture.     YOU SHOULD SEEK MEDICAL ATTENTION IMMEDIATELY, EITHER HERE OR AT THE NEAREST EMERGENCY DEPARTMENT, IF ANY OF THE FOLLOWING OCCUR:  Your vision changes suddenly.  Severe pain in the eye.  Your eyelids swell up, especially if you have a headache, vomiting or fever (temperature higher than 100.76F / 38C).  Any of the symptoms get worse or you have any other concerns.        Conjunctivitis     You were diagnosed with conjunctivitis. This is also called "pink eye".     Conjunctivitis is an inflammation of the conjunctiva. These are the thin coverings of the white part of the eye and insides of the eyelids. It is caused by many different things. This includes viruses and bacteria. It even includes chemicals. Particles of junk that irritate the eye can also be a  cause. Viruses are the most common cause.     Symptoms of conjunctivitis include pink eye and redness and drainage. It may feel like there is something in your eye (foreign body sensation). Your lid may get swollen. Your eyelids may also mat or get stuck in the morning.     Conjunctivitis can be very contagious. This means it can easily spread to others. You SHOULD NOT share hygienic items. This includes towels, make-up and tissues. You SHOULD NOT share clothing items. Wash your hands several times a day. Avoid touching your eyes.     Bacterial conjunctivitis is treated with warm compresses. It is also treated with ophthalmic (eye) antibiotics. These antibiotics are topical (not swallowed). Medicine is generally used for 5-7 days.     You SHOULD NOT wear contact lenses while you have conjunctivitis. Wait 48 hours after the infection completely clears up before using them again.     It is always a good idea to get rechecked by your eye doctor if possible.     YOU SHOULD SEEK MEDICAL ATTENTION IMMEDIATELY, EITHER HERE OR AT THE NEAREST EMERGENCY DEPARTMENT, IF ANY OF THE FOLLOWING OCCURS:  Increasing eye pain.  Vision problems (problems seeing).  Photophobia (light bothering your eyes).  You don't get better after a few days or symptoms get worse at any time.     If you can't follow up with your doctor, or if at any time you feel  you need to be rechecked or seen again, come back here or go to the nearest emergency department.

## 2020-08-09 NOTE — ED Provider Notes (Signed)
EMERGENCY DEPARTMENT HISTORY AND PHYSICAL EXAM    Date Time: 08/09/20 2:57 PM  Patient Name: Tonya Camacho  Attending Physician: Merceda Elks MD.               History of Presenting Illness       Chief Complaint:   Chief Complaint   Patient presents with    Eye Problem       Tonya Camacho is a 35 y.o. female who presents with eye itching and discharge. She states she has h/o pink eye responsive to eye drops. She notes R eye itching and mild discharge. Denies URI symptoms, eye pain, eye trauma, fever, vision changes. She wears glasses during the week and contact lenses on the weekends.    Pt wears contact lenses and glasses     This history was obtained from the(a) patient    Past Medical History     Past Medical History:   Diagnosis Date    Environmental allergies        Past Surgical History     Past Surgical History:   Procedure Laterality Date    EXCISION CYST/LIPOMA      TONSILLECTOMY         Family History     No family history on file.    Social History     Social History     Socioeconomic History    Marital status: Single     Spouse name: Not on file    Number of children: Not on file    Years of education: Not on file    Highest education level: Not on file   Occupational History    Not on file   Tobacco Use    Smoking status: Former     Pack years: 0.00     Types: Cigarettes    Smokeless tobacco: Never   Vaping Use    Vaping Use: Never used   Substance and Sexual Activity    Alcohol use: Yes    Drug use: Never    Sexual activity: Not on file   Other Topics Concern    Not on file   Social History Narrative    Not on file     Social Determinants of Health     Financial Resource Strain: Not on file   Food Insecurity: Not on file   Transportation Needs: Not on file   Physical Activity: Not on file   Stress: Not on file   Social Connections: Not on file   Intimate Partner Violence: Not on file   Housing Stability: Not on file       Allergies     Allergies   Allergen Reactions    Doxycycline  Hives    Penicillins     Unknown [Other Drug]      Unknown eye drop - "made infection worse"        Medications     No current facility-administered medications for this encounter.    Current Outpatient Medications:     ACYCLOVIR PO, Take by mouth, Disp: , Rfl:     ciprofloxacin (CILOXAN) 0.3 % ophthalmic solution, Place 1 drop into the right eye every 2 (two) hours Administer 1 drop, every 2 hours, while awake, for 2 days. Then 1 drop, every 4 hours, while awake, for the next 5 days., Disp: 5 mL, Rfl: 0    Review of Systems     Pertinent Positives and Negatives noted in the HPI.  All Other Systems Reviewed  and Negative: Yes      Physical Exam     Physical Exam  Vitals and nursing note reviewed.   Constitutional:       Appearance: Normal appearance. She is not ill-appearing.      Comments: 133/83  Afebrile  Well appearing  NAD   HENT:      Head: Normocephalic.      Nose: No rhinorrhea.   Eyes:      Extraocular Movements: Extraocular movements intact.      Pupils: Pupils are equal, round, and reactive to light.      Comments: Mild erythema R lateral eye  No discharge   PERRL  EOMI  No proptosis   Musculoskeletal:      Cervical back: Normal range of motion and neck supple.   Skin:     General: Skin is warm.      Findings: No erythema.   Neurological:      Mental Status: She is alert.   Psychiatric:         Mood and Affect: Mood normal.         Diagnostic Study Results     Labs -   Labs Reviewed - No data to display    Radiologic Studies -   No orders to display         Clinical Course in the Emergency Department/Medical Decision Making     I reviewed the vital signs, nursing notes, past medical history, past surgical history, family history and social history.    Vital Signs - BP: 133/83, Temp: 97.2 F (36.2 C), Temp Source: Temporal, Heart Rate: 83, Resp Rate: 18, SpO2: 99 %, Height: 5\' 8"  (172.7 cm), Weight: 110.2 kg    Pulse Oximetry Analysis - nl without need for supplemental oxygen            Labs:I have  reviewed the labs at the time of visit.  Merceda Elks MD.      Differential Diagnosis (not completely inclusive): conjunctivitis-bacterial, allergic, viral, foreign body, corneal abrasion    Very mild redness to R lateral eye and no discharge appreciated. Will place on antibiotic drops-Rx for Cipro. Referred to ophtho and return precautions reviewed.          Final diagnoses:   Irritation of right eye     New Prescriptions    CIPROFLOXACIN (CILOXAN) 0.3 % OPHTHALMIC SOLUTION    Place 1 drop into the right eye every 2 (two) hours Administer 1 drop, every 2 hours, while awake, for 2 days. Then 1 drop, every 4 hours, while awake, for the next 5 days.     ED Disposition       ED Disposition   Discharge    Condition   --    Date/Time   Thu Aug 09, 2020  3:12 PM    Comment   Tonya Camacho discharge to home/self care.    Condition at disposition: Stable                 _______________________________    Attestations:    I was acting as a scribe for Jobe Gibbon, MD on Tonya Camacho     I am the first provider for this patient and I personally performed the services documented.  is scribing for me on [facility]. This note accurately reflects work and decisions made by me.  Jobe Gibbon, MD  _______________________________  Candelaria Stagers, MD  08/09/20 [phone]

## 2021-08-19 ENCOUNTER — Inpatient Hospital Stay: Admit: 2021-08-19 | Payer: Self-pay

## 2021-08-19 LAB — RUBEOLA ANTIBODY IGG: Rubeola (Measles), IgG: >300 AU/mL

## 2021-08-19 LAB — VARICELLA ZOSTER ANTIBODY, IGG: Varicella, IgG: 2177 Index

## 2021-08-19 LAB — HEPATITIS B SURFACE ANTIBODY: HEPATITIS B SURFACE ANTIBODY: 311 m[IU]/mL

## 2021-08-19 LAB — MUMPS ANTIBODY, IGG: Mumps Ab, IgG: >300 AU/mL

## 2021-08-19 LAB — RUBELLA ANTIBODY, IGG: Rubella AB, IgG: 10 Index

## 2021-08-22 LAB — QUANTIFERON(R)-TB GOLD PLUS
Mitogen-NIL: >10 IU/mL
NIL: 0.02 IU/mL
Quantiferon TB Gold Plus: NEGATIVE
TB1-NIL: 0.01 IU/mL
TB2-NIL: 0.01 IU/mL

## 2022-01-31 IMAGING — CR DG CHEST 2V
2 series · 2 of 2 positions shown · non-contrast
Comparison: None.

CLINICAL DATA: Chest pain and cardiac palpitations

EXAM:
CHEST - 2 VIEW

[w chest pa]
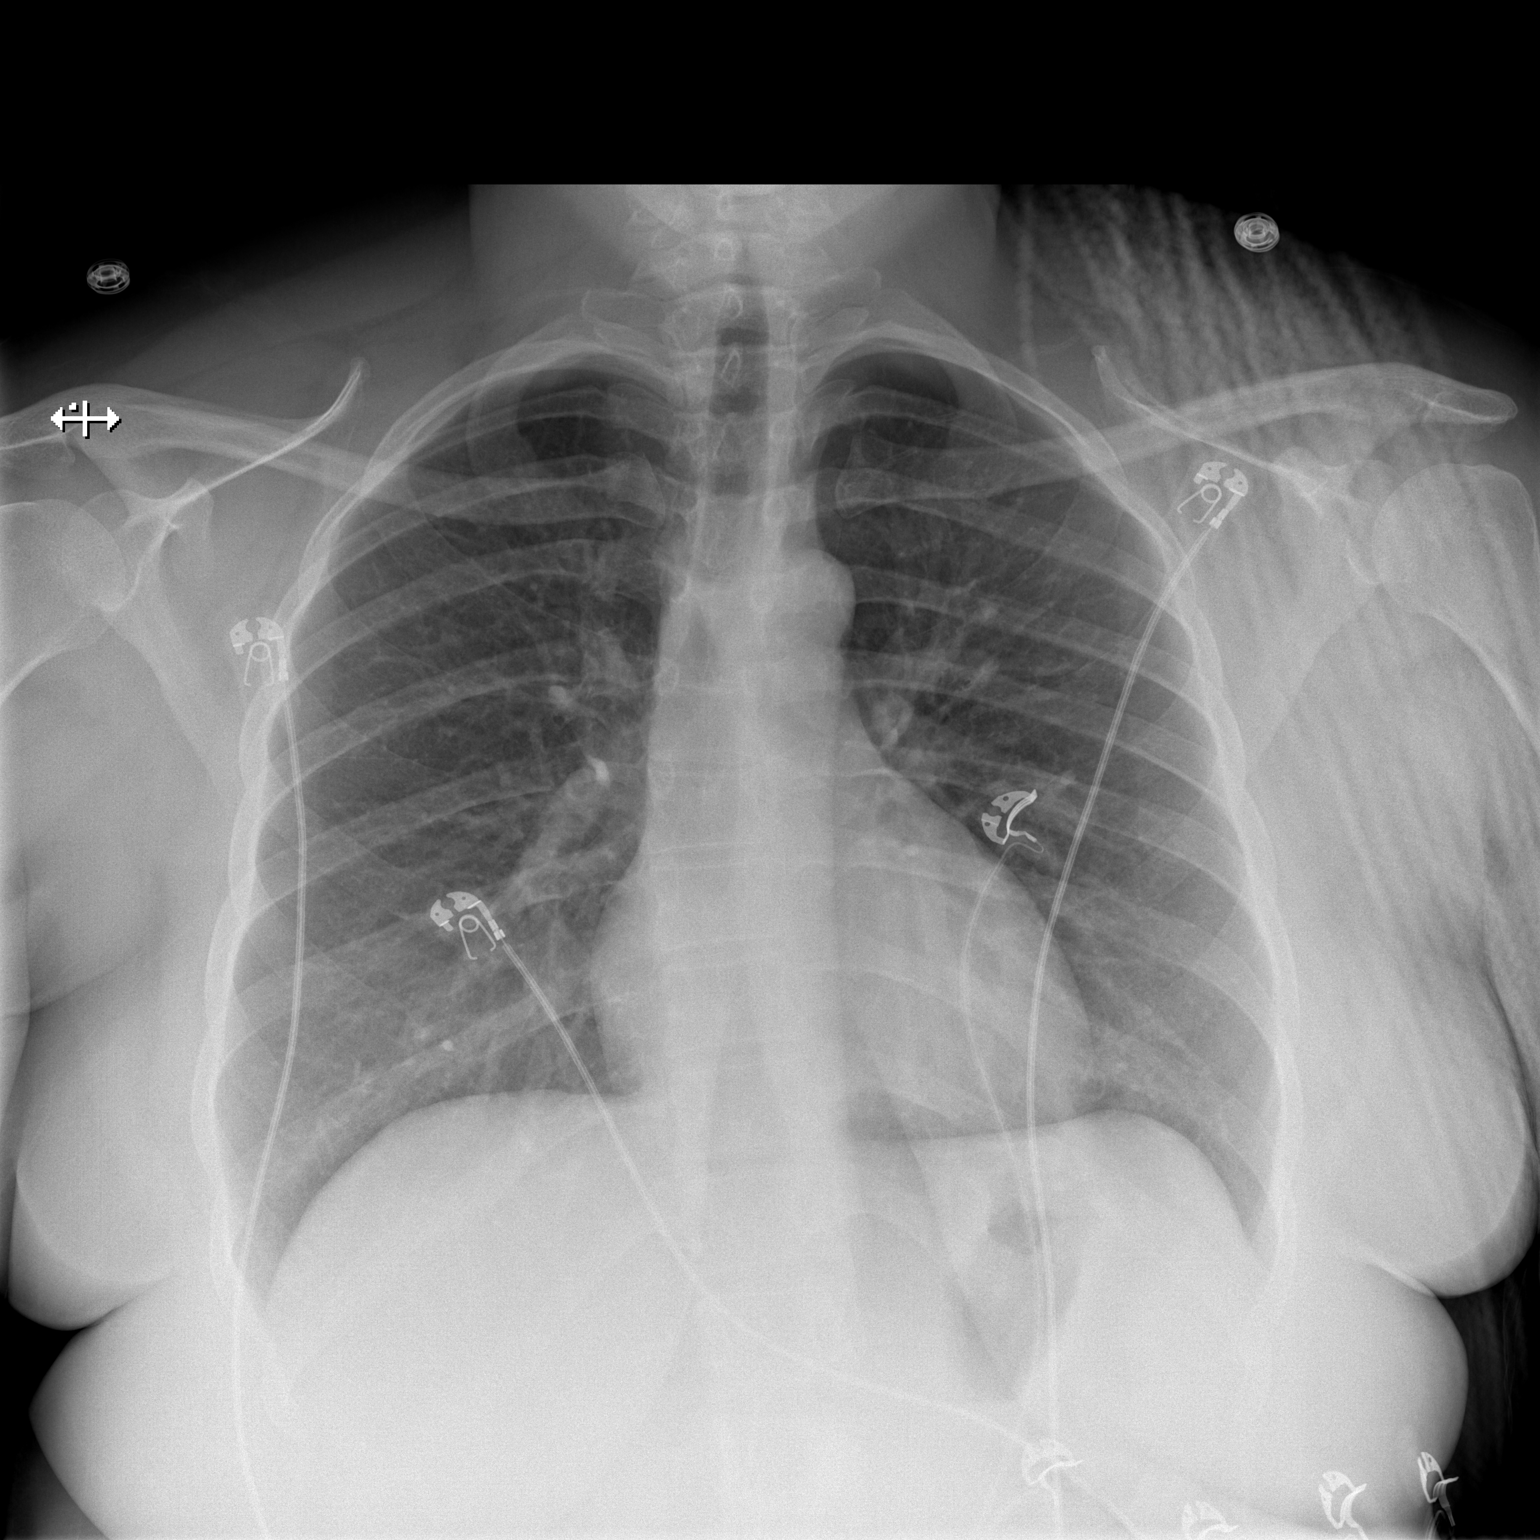

[w chest lat]
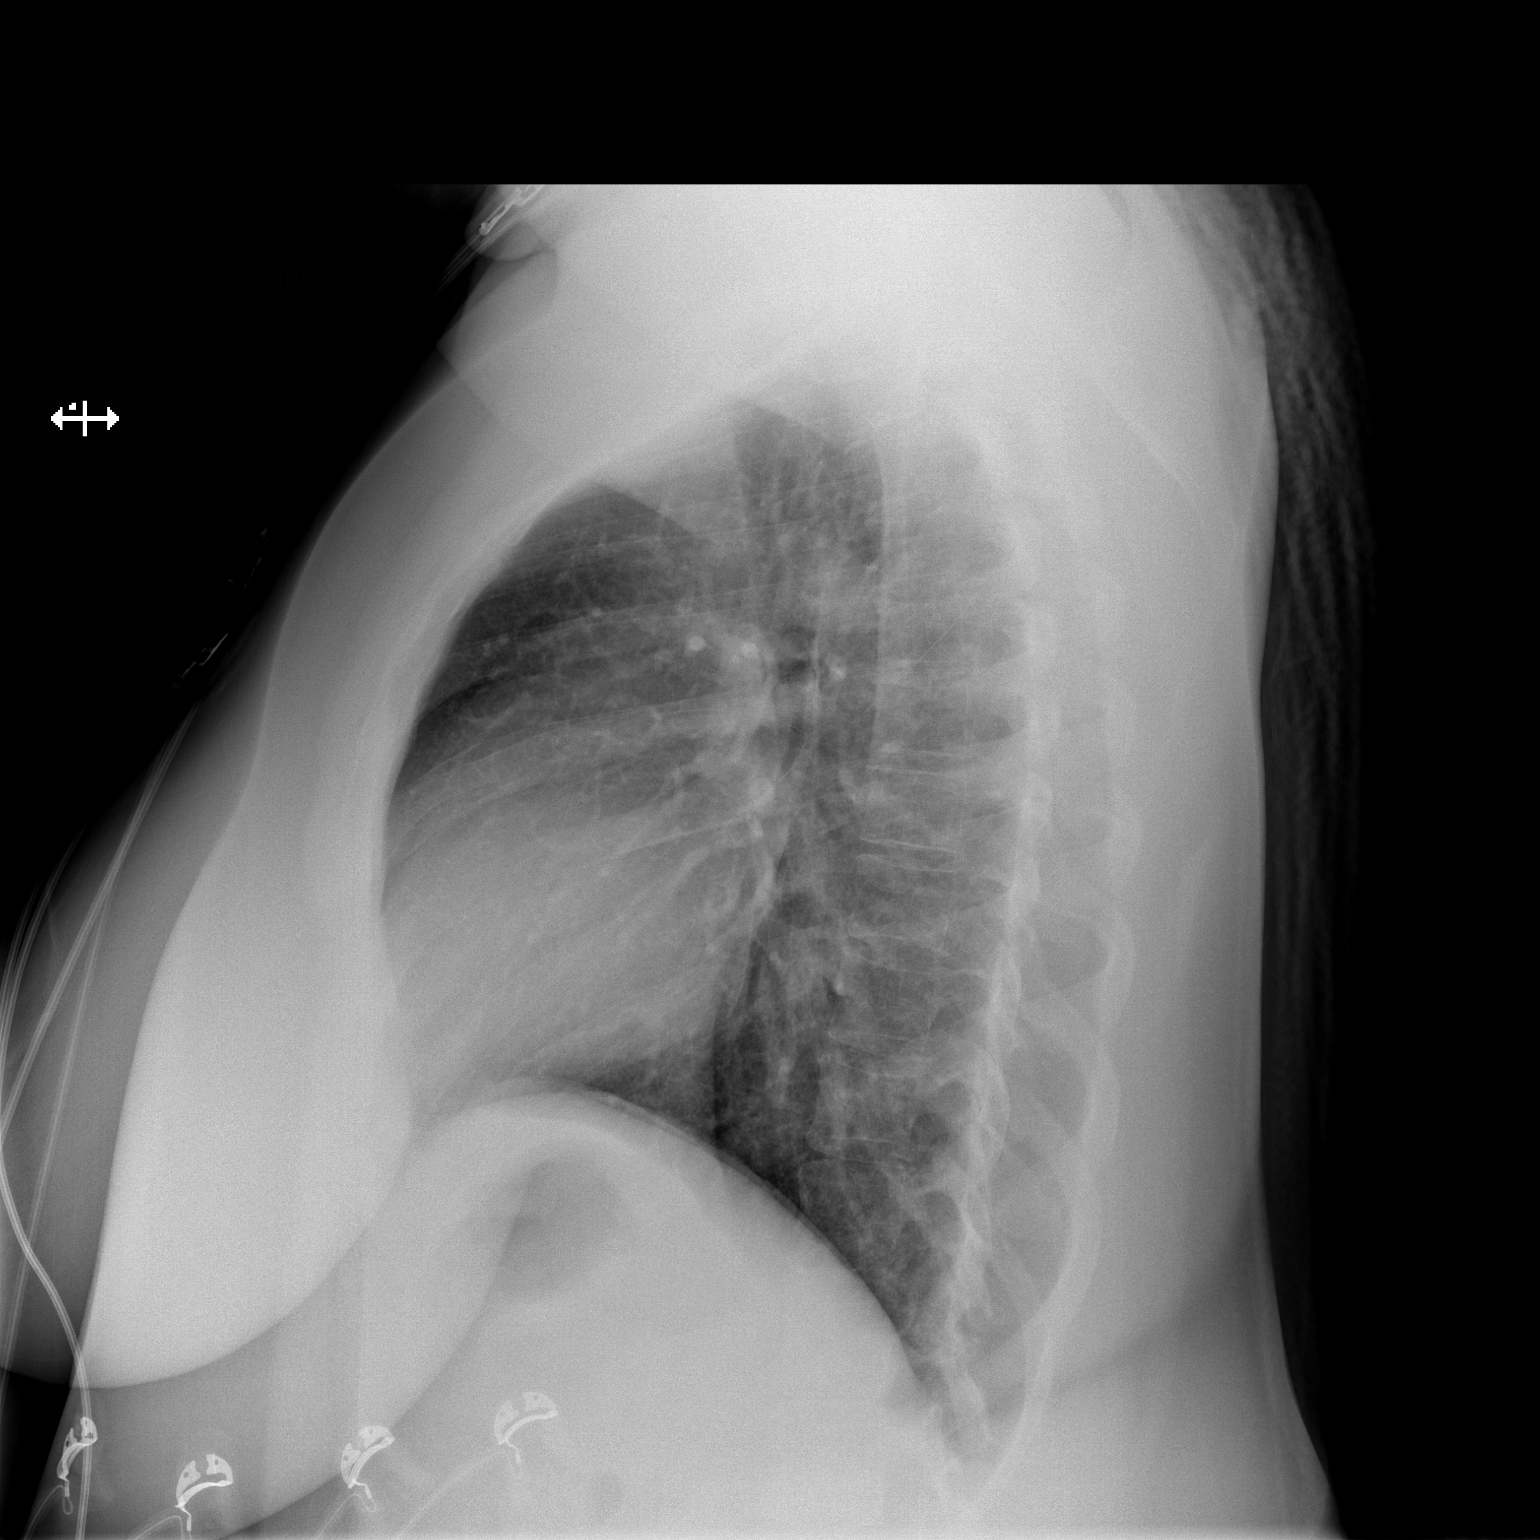

[2 of 2 positions shown; findings below may reference images not displayed]

FINDINGS: Lungs are clear. Heart size and pulmonary vascularity are normal. No
adenopathy. No bone lesions. No pneumothorax.
IMPRESSION: Lungs clear.  Cardiac silhouette normal.

## 2022-10-14 ENCOUNTER — Other Ambulatory Visit (FREE_STANDING_LABORATORY_FACILITY): Payer: Self-pay | Admitting: Student in an Organized Health Care Education/Training Program

## 2023-01-06 ENCOUNTER — Ambulatory Visit (INDEPENDENT_AMBULATORY_CARE_PROVIDER_SITE_OTHER): Payer: Self-pay | Admitting: Certified Nurse Midwife

## 2023-01-06 ENCOUNTER — Encounter (INDEPENDENT_AMBULATORY_CARE_PROVIDER_SITE_OTHER): Payer: Self-pay | Admitting: Certified Nurse Midwife

## 2023-01-06 VITALS — BP 131/86 | HR 85 | Temp 97.9°F | Ht 68.0 in | Wt 277.0 lb

## 2023-01-06 DIAGNOSIS — N898 Other specified noninflammatory disorders of vagina: Secondary | ICD-10-CM

## 2023-01-06 NOTE — Progress Notes (Signed)
 Chief Complaint: Gynecologic Exam (NP, G0, 37yo, pt presents with complaint of vaginal discharge, white and milky "excessive.")      History of Present Illness: Tonya Camacho is a 37 y.o. female, G0P0000, Patient's last menstrual period was 12/23/2022 (ex

## 2023-01-07 LAB — CHLAMYDIA TRACHOMATIS, NEISSERIA GONORRHEA AND TRICHOMONAS VAGINALIS, PCR
Chlamydia trachomatis DNA: NEGATIVE
Neisseria gonorrhoeae DNA: NEGATIVE
Trichomonas vaginalis DNA: NEGATIVE

## 2023-01-07 LAB — VAGINAL CANDIDA SPP., PCR
Candida glabrata DNA: NOT DETECTED
Candida group DNA: NOT DETECTED
Candida krusei DNA: NOT DETECTED

## 2023-01-07 LAB — VAGINAL BACTERIAL VAGINOSIS, PCR: Bacterial vaginosis marker DNA: DETECTED — AB

## 2023-01-08 ENCOUNTER — Other Ambulatory Visit (INDEPENDENT_AMBULATORY_CARE_PROVIDER_SITE_OTHER): Payer: Self-pay | Admitting: Certified Nurse Midwife

## 2023-01-08 ENCOUNTER — Encounter (INDEPENDENT_AMBULATORY_CARE_PROVIDER_SITE_OTHER): Payer: Self-pay | Admitting: Certified Nurse Midwife

## 2023-01-08 DIAGNOSIS — N760 Acute vaginitis: Secondary | ICD-10-CM

## 2023-01-08 MED ORDER — METRONIDAZOLE 500 MG PO TABS
500.0000 mg | ORAL_TABLET | Freq: Two times a day (BID) | ORAL | 0 refills | Status: DC
Start: 2023-01-08 — End: 2023-01-09

## 2023-01-09 ENCOUNTER — Other Ambulatory Visit (INDEPENDENT_AMBULATORY_CARE_PROVIDER_SITE_OTHER): Payer: Self-pay | Admitting: Certified Nurse Midwife

## 2023-01-09 ENCOUNTER — Telehealth (INDEPENDENT_AMBULATORY_CARE_PROVIDER_SITE_OTHER): Payer: Self-pay

## 2023-01-09 DIAGNOSIS — N760 Acute vaginitis: Secondary | ICD-10-CM

## 2023-01-09 MED ORDER — METRONIDAZOLE 500 MG PO TABS
500.0000 mg | ORAL_TABLET | Freq: Two times a day (BID) | ORAL | 0 refills | Status: AC
Start: 2023-01-09 — End: 2023-01-16

## 2023-01-09 NOTE — Telephone Encounter (Signed)
 Pt left voicemail on nurse's line stating that she needs her prescriptions [facility] to [facility] of [address] in [place] [facility] of [facility] [place]. I notified Gaspar Garbe, CNM who placed the orders. Upon call back and verification of full name and

## 2023-02-08 ENCOUNTER — Emergency Department
Admission: EM | Admit: 2023-02-08 | Discharge: 2023-02-08 | Disposition: A | Discharge status: Home / Self Care | Payer: No Typology Code available for payment source

## 2023-02-08 DIAGNOSIS — R519 Headache, unspecified: Secondary | ICD-10-CM | POA: Insufficient documentation

## 2023-02-08 LAB — LAB USE ONLY - CBC WITH DIFFERENTIAL
Absolute Basophils: 0.03 10*3/uL (ref 0.00–0.08)
Absolute Eosinophils: 0.12 10*3/uL (ref 0.00–0.44)
Absolute Immature Granulocytes: 0.02 10*3/uL (ref 0.00–0.07)
Absolute Lymphocytes: 2.56 10*3/uL (ref 0.42–3.22)
Absolute Monocytes: 0.42 10*3/uL (ref 0.21–0.85)
Absolute Neutrophils: 5.28 10*3/uL (ref 1.10–6.33)
Absolute nRBC: 0 10*3/uL (ref <=0.00)
Basophils %: 0.4 %
Eosinophils %: 1.4 %
Hematocrit: 43.1 % (ref 34.7–43.7)
Hemoglobin: 13.9 g/dL (ref 11.4–14.8)
Immature Granulocytes %: 0.2 %
Lymphocytes %: 30.4 %
MCH: 27.5 pg (ref 25.1–33.5)
MCHC: 32.3 g/dL (ref 31.5–35.8)
MCV: 85.3 fL (ref 78.0–96.0)
MPV: 10.4 fL (ref 8.9–12.5)
Monocytes %: 5 %
Neutrophils %: 62.6 %
Platelet Count: 281 10*3/uL (ref 142–346)
Preliminary Absolute Neutrophil Count: 5.28 10*3/uL (ref 1.10–6.33)
RBC: 5.05 10*6/uL (ref 3.90–5.10)
RDW: 15 % (ref 11–15)
WBC: 8.43 10*3/uL (ref 3.10–9.50)
nRBC %: 0 /100{WBCs} (ref <=0.0)

## 2023-02-08 LAB — BASIC METABOLIC PANEL
Anion Gap: 9 (ref 5.0–15.0)
BUN: 10 mg/dL (ref 7–21)
CO2: 22 meq/L (ref 17–29)
Calcium: 9.4 mg/dL (ref 8.5–10.5)
Chloride: 107 meq/L (ref 99–111)
Creatinine: 0.9 mg/dL (ref 0.4–1.0)
GFR: >60 mL/min/{1.73_m2} (ref >=60.0)
Glucose: 84 mg/dL (ref 70–100)
Potassium: 4.4 meq/L (ref 3.5–5.3)
Sodium: 138 meq/L (ref 135–145)

## 2023-02-08 LAB — PT/INR
INR: 1.1 (ref 0.9–1.1)
PT: 11.9 s (ref 10.1–12.9)

## 2023-02-08 LAB — BETA HCG QUANTITATIVE, PREGNANCY: hCG, Quantitative: <2.4 m[IU]/mL

## 2023-02-08 MED ORDER — DIPHENHYDRAMINE HCL 50 MG/ML IJ SOLN
25.0000 mg | Freq: Once | INTRAMUSCULAR | Status: AC
Start: 2023-02-08 — End: 2023-02-08
  Administered 2023-02-08: 25 mg via INTRAVENOUS
  Filled 2023-02-08: qty 1

## 2023-02-08 MED ORDER — ACETAMINOPHEN 325 MG PO TABS
650.0000 mg | ORAL_TABLET | Freq: Once | ORAL | Status: AC
Start: 2023-02-08 — End: 2023-02-08
  Administered 2023-02-08: 650 mg via ORAL
  Filled 2023-02-08: qty 2

## 2023-02-08 MED ORDER — KETOROLAC TROMETHAMINE 15 MG/ML IJ SOLN
15.0000 mg | Freq: Once | INTRAMUSCULAR | Status: AC
Start: 2023-02-08 — End: 2023-02-08
  Administered 2023-02-08: 15 mg via INTRAVENOUS
  Filled 2023-02-08: qty 1

## 2023-02-08 MED ORDER — PROCHLORPERAZINE EDISYLATE 10 MG/2ML IJ SOLN
10.0000 mg | Freq: Once | INTRAMUSCULAR | Status: AC
Start: 2023-02-08 — End: 2023-02-08
  Administered 2023-02-08: 10 mg via INTRAVENOUS

## 2023-02-08 MED ORDER — METOCLOPRAMIDE HCL 5 MG/ML IJ SOLN
10.0000 mg | Freq: Once | INTRAMUSCULAR | Status: AC
Start: 2023-02-08 — End: 2023-02-08
  Administered 2023-02-08: 10 mg via INTRAVENOUS
  Filled 2023-02-08: qty 2

## 2023-02-08 MED ORDER — DIPHENHYDRAMINE HCL 50 MG/ML IJ SOLN
25.0000 mg | Freq: Once | INTRAMUSCULAR | Status: AC
Start: 2023-02-08 — End: 2023-02-08
  Administered 2023-02-08: 25 mg via INTRAVENOUS

## 2023-02-08 NOTE — ED Provider Notes (Signed)
 History     Chief Complaint   Patient presents with    Headache     HPI     37 year old female, past medical history headache, anxiety, presents to the ED for headache.    Patient states that she has a history of migraines, and she has been having pe

## 2023-02-08 NOTE — Discharge Instructions (Signed)
 Today you were seen in the ED for headache. We gave you some medications, to treat your symptoms.  We believe that you are safe for discharge home. Please follow-up with your primary care provider within the next week.  We have also provided you with a pho

## 2023-02-25 ENCOUNTER — Telehealth (INDEPENDENT_AMBULATORY_CARE_PROVIDER_SITE_OTHER): Payer: Self-pay | Admitting: Certified Nurse Midwife

## 2023-02-25 ENCOUNTER — Encounter (INDEPENDENT_AMBULATORY_CARE_PROVIDER_SITE_OTHER): Payer: Self-pay | Admitting: Certified Nurse Midwife

## 2023-02-25 DIAGNOSIS — N760 Acute vaginitis: Secondary | ICD-10-CM

## 2023-02-25 MED ORDER — METRONIDAZOLE 500 MG PO TABS
500.0000 mg | ORAL_TABLET | Freq: Two times a day (BID) | ORAL | 0 refills | Status: AC
Start: 2023-02-25 — End: 2023-03-04

## 2023-02-25 NOTE — Telephone Encounter (Addendum)
 Called patient. Name and DOB verified. Discussed concerns for BV. We discussed considering prolonged therapies. We discussed options including oral metronidazole  for an induction method and then maintenance with vaginal metronidazole  versus boric acid x 30 days. Pt. Has utilized Boric Acid in the past and desires to utilize again. Counseled on importance of avoiding Boric Acid orally as can cause severe reaction and death. Counseled on risks/benefits of metronidazole . Aware to avoid etoh use during therapy and for 3 days after completion of medication. If symptoms not improving, alert providers to come in person for evaluation. Pt. Verbalized understanding and is agreeable.

## 2023-03-19 ENCOUNTER — Ambulatory Visit (INDEPENDENT_AMBULATORY_CARE_PROVIDER_SITE_OTHER): Payer: No Typology Code available for payment source | Admitting: Family Medicine

## 2023-03-19 ENCOUNTER — Encounter (INDEPENDENT_AMBULATORY_CARE_PROVIDER_SITE_OTHER): Payer: Self-pay | Admitting: Family Medicine

## 2023-03-19 VITALS — BP 120/86 | HR 75 | Temp 98.7°F | Resp 20 | Ht 68.0 in | Wt 264.4 lb

## 2023-03-19 DIAGNOSIS — Z6841 Body Mass Index (BMI) 40.0 and over, adult: Secondary | ICD-10-CM

## 2023-03-19 DIAGNOSIS — E66813 Obesity, class 3: Secondary | ICD-10-CM

## 2023-03-19 DIAGNOSIS — G43E09 Chronic migraine with aura, not intractable, without status migrainosus: Secondary | ICD-10-CM

## 2023-03-19 MED ORDER — RIZATRIPTAN BENZOATE 10 MG PO TABS
10.0000 mg | ORAL_TABLET | ORAL | 5 refills | Status: DC | PRN
Start: 2023-03-19 — End: 2023-04-07

## 2023-03-19 MED ORDER — TOPIRAMATE ER 25 MG PO CP24
1.0000 | ORAL_CAPSULE | Freq: Every day | ORAL | 0 refills | Status: DC
Start: 2023-03-19 — End: 2023-04-07

## 2023-03-19 NOTE — Progress Notes (Signed)
 Primary Care - [facility]     Subjective:      Date: 03/19/2023 7:40 PM   Patient ID: Tonya Camacho is a 38 y.o. female.    Chief Complaint:  Chief Complaint   Patient presents with    Establish Care    Migraine     Started 1 month. Went to the ED on 02/08/23 for the same thing     health concern     Was approved for VSG through [place] but fell through due to new job.       HPI:  HPI  Tonya Camacho is a 38 y.o. female presenting to establish care    She reports she is diagnosed with migraines   Photosentiivty, nausea, vomiting, focal, and throbbing;  Has to sleep in dark room; Reports associated aura  Completed an MRI brain with [facility] 2024 which normal   Has tried Imitrex but states it did not seem to work   Started a new job - reports stressful and lighting at work may be triggering it  Normally has been 3-4 times per month but lately has been almost daily.    Patient states she was approved for Gastric Sleeve at [facility] but has since switch insurance.      Problem List:  Problem List[1]    Current Medications:  Medications Taking[2]    Allergies:  Allergies[3]    Past Medical History:  Medical History[4]    Past Surgical History:  Past Surgical History[5]    Family History:  Family History[6]    Social History:  Social History[7]     The following sections were reviewed this encounter by the provider:   Tobacco  Allergies  Meds  Problems  Med Hx  Surg Hx  Fam Hx         ROS:  Review of Systems   Constitutional:  Negative for chills, fatigue and fever.   Respiratory:  Negative for cough and shortness of breath.    Cardiovascular:  Negative for chest pain, palpitations and leg swelling.   Gastrointestinal: Negative.    Neurological:  Positive for headaches.         Objective:   Visit Vitals  BP 120/86 (BP Site: Right arm, Patient Position: Sitting, Cuff Size: Large)   Pulse 75   Temp 98.7 F (37.1 C)   Resp 20   Ht 1.727 m (5' 8)   Wt 119.9 kg (264 lb 6.4 oz)   LMP 03/15/2023   SpO2  98%   BMI 40.20 kg/m       Physical Exam:  General Examination:   Physical Exam  Constitutional:       Appearance: Normal appearance.   Cardiovascular:      Rate and Rhythm: Normal rate.   Pulmonary:      Effort: Pulmonary effort is normal.   Neurological:      General: No focal deficit present.      Mental Status: She is alert and oriented to person, place, and time.          Assessment and Plan:   Tonya Camacho was seen today  for establish care, migraine and health concern.    Diagnoses and all orders for this visit:    Chronic migraine with aura without status migrainosus, not intractable  (+) Recurrent migraines that are frequent. Discussed treatment options. Patient to start on topiramate  25 mg qd. Will also prescribed maxalt  to use as needed for break through migraines. Recommend follow up in 4  weeks to reassess.   -     Topiramate  ER 25 MG Capsule SR 24 hr; Take 1 capsule (25 mg) by mouth daily  -     rizatriptan  (MAXALT ) 10 MG tablet; Take 1 tablet (10 mg) by mouth as needed for Migraine May repeat one additional dose in 2 hours if needed    Class 3 severe obesity due to excess calories without serious comorbidity with body mass index (BMI) of 40.0 to 44.9 in adult  Patient is currently on semaglutide  for weight loss through an online source. Patient to consider transitioning care for weight loss medicine to this clinic and will follow up to discussed further in 4 weeks . Referral placed for bariatric surgery to possibly continue arrangements for gastric sleeve.   -     Referral to Bariatric Surgery and Obesity Medicine; Future    Other orders  -     Follow Up In Primary Care; Future    Follow-up:   No follow-ups on file.       Meade MALVA Drop, DO          [1] There is no problem list on file for this patient.   [2]   Outpatient Medications Marked as Taking for the 03/19/23 encounter (Office Visit) with Drop Meade MALVA, DO   Medication Sig Dispense Refill    ACYCLOVIR PO Take by mouth      semaglutide  (Ozempic ,  0.25 or 0.5 MG/DOSE,) 2 MG/3ML Inject 0.25 mg into the skin once a week      valACYclovir  HCL (VALTREX ) 500 MG tablet Take 1 tablet (500 mg) by mouth 2 (two) times daily     [3]   Allergies  Allergen Reactions    Doxycycline Hives, Itching and Nausea And Vomiting    Penicillins Hives and Itching    Doxycycline Hives    Penicillins     Unknown [Other Drug]      Unknown eye drop - made infection worse    [4]   Past Medical History:  Diagnosis Date    Anxiety     Depression     Environmental allergies     Gastroesophageal reflux disease     Diagnosed as a child but recently informed that I no longer have it    Obesity     Pneumonia     Seasonal allergic rhinitis     STD (sexually transmitted disease)     Genital Herpes   [5]   Past Surgical History:  Procedure Laterality Date    COSMETIC SURGERY      Lipoma removal    EXCISION CYST/LIPOMA      TONSILLECTOMY     [6]   Family History  Problem Relation Name Age of Onset    Hypertension Mother Saory Carriero     Arthritis Mother Oneta Sigman     Obesity Mother Emilina Smarr     Asthma Father Blenda Wisecup     Hypertension Father Remie Mathison     Alcohol abuse Father Irja Wheless     Arthritis Father Shandiin Eisenbeis     Diabetes Father Nelma Phagan     Alcohol abuse Paternal Grandfather Claudean Slocumb     Early death Brother Layman Slocumb     Kidney failure Brother Layman Slocumb     Obesity Sister Oddis Slocumb     Obesity Sister Virgia Glad    [7]   Social History  Tobacco Use    Smoking status: Never    Smokeless  tobacco: Never   Vaping Use    Vaping status: Never Used   Substance Use Topics    Alcohol use: Yes     Alcohol/week: 1.0 standard drink of alcohol     Types: 1 Glasses of wine per week     Comment: 2/month    Drug use: Never

## 2023-03-19 NOTE — Progress Notes (Signed)
 Have you seen any specialists/other providers since your last visit with [place]?    Yes weight loss     Health Maintenance Due   Topic Date Due    COVID-19 Vaccine (4 - 2024-25 season) 10/19/2022

## 2023-03-20 ENCOUNTER — Encounter (INDEPENDENT_AMBULATORY_CARE_PROVIDER_SITE_OTHER): Payer: Self-pay | Admitting: Family Medicine

## 2023-03-21 DIAGNOSIS — J101 Influenza due to other identified influenza virus with other respiratory manifestations: Secondary | ICD-10-CM

## 2023-03-21 HISTORY — DX: Influenza due to other identified influenza virus with other respiratory manifestations: J10.1

## 2023-03-24 ENCOUNTER — Emergency Department
Admission: EM | Admit: 2023-03-24 | Discharge: 2023-03-24 | Disposition: A | Discharge status: Home / Self Care | Payer: No Typology Code available for payment source | Attending: Emergency Medicine | Admitting: Emergency Medicine

## 2023-03-24 DIAGNOSIS — J101 Influenza due to other identified influenza virus with other respiratory manifestations: Secondary | ICD-10-CM | POA: Insufficient documentation

## 2023-03-24 LAB — URINALYSIS WITH REFLEX TO MICROSCOPIC EXAM - REFLEX TO CULTURE
Urine Bilirubin: NEGATIVE
Urine Blood: NEGATIVE
Urine Glucose: NEGATIVE
Urine Leukocyte Esterase: NEGATIVE
Urine Nitrite: NEGATIVE
Urine Specific Gravity: 1.038 — ABNORMAL HIGH (ref 1.001–1.035)
Urine Urobilinogen: 6 mg/dL — AB (ref 0.2–2.0)
Urine pH: 6 (ref 5.0–8.0)

## 2023-03-24 LAB — URINE HCG QUALITATIVE: Urine HCG Qualitative: NEGATIVE

## 2023-03-24 LAB — COVID-19 (SARS-COV-2) & INFLUENZA  A/B, NAA (ROCHE LIAT)
Influenza A RNA: DETECTED — AB
Influenza B RNA: NOT DETECTED
SARS-CoV-2 (COVID-19) RNA: NOT DETECTED

## 2023-03-24 MED ORDER — ALBUTEROL SULFATE HFA 108 (90 BASE) MCG/ACT IN AERS
2.0000 | INHALATION_SPRAY | RESPIRATORY_TRACT | 0 refills | Status: AC | PRN
Start: 2023-03-24 — End: 2023-06-23

## 2023-03-24 MED ORDER — ONDANSETRON 4 MG PO TBDP
4.0000 mg | ORAL_TABLET | Freq: Four times a day (QID) | ORAL | 0 refills | Status: AC | PRN
Start: 2023-03-24 — End: ?

## 2023-03-24 MED ORDER — ONDANSETRON 4 MG PO TBDP
4.0000 mg | ORAL_TABLET | Freq: Once | ORAL | Status: AC
Start: 2023-03-24 — End: 2023-03-24
  Administered 2023-03-24: 4 mg via ORAL
  Filled 2023-03-24: qty 1

## 2023-03-24 MED ORDER — OSELTAMIVIR PHOSPHATE 75 MG PO CAPS
75.0000 mg | ORAL_CAPSULE | Freq: Two times a day (BID) | ORAL | 0 refills | Status: AC
Start: 2023-03-24 — End: 2023-03-29

## 2023-03-24 MED ORDER — IBUPROFEN 600 MG PO TABS
600.0000 mg | ORAL_TABLET | Freq: Four times a day (QID) | ORAL | 0 refills | Status: AC | PRN
Start: 2023-03-24 — End: ?

## 2023-03-24 MED ORDER — PSEUDOEPHEDRINE HCL 30 MG PO TABS
30.0000 mg | ORAL_TABLET | ORAL | 0 refills | Status: DC | PRN
Start: 2023-03-24 — End: 2023-04-21

## 2023-03-24 NOTE — ED Provider Notes (Signed)
 EMERGENCY DEPARTMENT HISTORY AND PHYSICAL EXAM     None        Date: 03/24/2023  Patient Name: Tonya Camacho    History of Presenting Illness     Chief Complaint   Patient presents with    Flu like symptoms       History Provided By: Patient    Chief Complaint: flu like illness  Duration: 3 days ago  Timing:  Gradual, Constant  Associated Symptoms: body aches, crampy abd pain, diarrhea  Pertinent Negatives: CP.     Additional History: Tonya Camacho is a 38 y.o. female presenting to the ED with flu like illness. Pt has had URI symptoms and diarrhea. She had a flu test 3 days ago. No fever at home. She's been drinking Gatorade and keeping it down. 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     PCP: Lawson Meade KIDD, DO  SPECIALISTS:    Current Medications[1]    Past History     Past Medical History:  Past Medical History:   Diagnosis Date    Anxiety     Depression     Environmental allergies     Gastroesophageal reflux disease     Diagnosed as a child but recently informed that I no longer have it    Obesity     Pneumonia     Seasonal allergic rhinitis     STD (sexually transmitted disease)     Genital Herpes       Past Surgical History:  Past Surgical History[2]    Family History:  Family History[3]    Social History:  Social History[4]    Allergies:  Allergies[5]    Review of Systems     Review of Systems   Constitutional:  Positive for fatigue.   Respiratory:  Positive for cough.    Cardiovascular:  Negative for chest pain.   Gastrointestinal:  Positive for diarrhea.       Physical Exam   BP 125/88   Pulse 90   Temp 97.9 F (36.6 C) (Oral)   Resp 16   Ht 5' 8 (1.727 m)   Wt 119.8 kg   LMP 03/15/2023 (Exact Date)   SpO2 99%   BMI 40.17 kg/m     Physical Exam  Vitals and nursing note reviewed.   Constitutional:       General: She is not in acute distress.     Appearance: She is well-developed.   HENT:      Head: Normocephalic and atraumatic.      Nose: Nose normal.      Mouth/Throat:      Mouth: Mucous  membranes are moist.      Pharynx: Oropharynx is clear.   Eyes:      Extraocular Movements: Extraocular movements intact.   Cardiovascular:      Rate and Rhythm: Normal rate and regular rhythm.      Heart sounds: Normal heart sounds.   Pulmonary:      Effort: Pulmonary effort is normal.      Breath sounds: Normal breath sounds. No wheezing.      Comments: Clear lungs  Abdominal:      Palpations: Abdomen is soft.      Tenderness: There is no abdominal tenderness.      Comments: No TTP    Musculoskeletal:         General: Normal range of motion.      Cervical back: Normal range of motion and neck supple.  Skin:     General: Skin is warm and dry.      Capillary Refill: Capillary refill takes less than 2 seconds.   Neurological:      Mental Status: She is alert and oriented to person, place, and time.   Psychiatric:         Mood and Affect: Mood normal.         Behavior: Behavior normal.         Diagnostic Study Results     Labs -     Results       Procedure Component Value Units Date/Time    Urinalysis with Reflex to Microscopic Exam and Culture [[id number]]  (Abnormal) Collected: 03/24/23 1557    Specimen: Urine, Clean Catch Updated: 03/24/23 1619     Urine Color Yellow     Urine Clarity Hazy     Urine Specific Gravity 1.038     Urine pH 6.0     Urine Leukocyte Esterase Negative     Urine Nitrite Negative     Urine Protein 30= 1+     Urine Glucose Negative     Urine Ketones Trace mg/dL      Urine Urobilinogen 6.0 mg/dL      Urine Bilirubin Negative     Urine Blood Negative     RBC, UA 0-2 /hpf      Urine WBC 0-5 /hpf      Urine Squamous Epithelial Cells 11-25 /hpf      Urine Mucus Present    Urine HCG Qualitative [[id number]]  (Normal) Collected: 03/24/23 1557    Specimen: Urine, Clean Catch Updated: 03/24/23 1618     Urine HCG Qualitative Negative    COVID-19 and Influenza ([place]) (symptomatic) [[id number]]  (Abnormal) Collected: 03/24/23 1502    Specimen: Swab from Anterior Nares Updated: 03/24/23 1544      SARS-CoV-2 (COVID-19) RNA Not Detected     Influenza A RNA Detected     Influenza B RNA Not Detected    Narrative:      A result of Detected indicates POSITIVE for the presence of viral RNA  A result of Not Detected indicates NEGATIVE for the presence of viral RNA    Test performed using the Roche cobas [place] SARS-CoV-2 & Influenza A/B assay. This is a multiplex real-time RT-PCR assay for the detection of SARS-CoV-2, influenza A, and influenza B virus RNA. Viral nucleic acids may persist in vivo, independent of viability. Detection of viral nucleic acid does not imply the presence of infectious virus, or that virus nucleic acid is the cause of clinical symptoms. Negative results do not preclude SARS-CoV-2, influenza A, and/or influenza B infection and should not be used as the sole basis for diagnosis, treatment or other patient management decisions. Invalid results may be due to inhibiting substances in the specimen and recollection should occur.             Radiologic Studies -   Radiology Results (24 Hour)       ** No results found for the last 24 hours. **        .    Medical Decision Making   I am the first provider for this patient.    I reviewed the vital signs, available nursing notes, past medical history, past surgical history, family history and social history.    Vital Signs-Reviewed the patient's vital signs.   No data found.      Pulse Oximetry Analysis - Normal 99% on RA  Labs: Yes.    All labs have been personally reviewed and interpreted by me.     Medication given in the ED (and reassessment):  improved w/meds listed below.   Medications   ondansetron  (ZOFRAN -ODT) disintegrating tablet 4 mg (4 mg Oral Given 03/24/23 1510)     Prescribed (and considered but not prescribed):   Discharge Medication List as of 03/24/2023  5:01 PM        START taking these medications    Details   albuterol  sulfate HFA (PROVENTIL ) 108 (90 Base) MCG/ACT inhaler Inhale 2 puffs into the lungs every 4 (four) hours as  needed for Wheezing (cough) Dispense with spacer, Starting Tue 03/24/2023, Until Thu 04/23/2023 at 2359, E-Rx      ibuprofen  (ADVIL ) 600 MG tablet Take 1 tablet (600 mg) by mouth every 6 (six) hours as needed for Pain or Fever, Starting Tue 03/24/2023, E-Rx      ondansetron  (ZOFRAN -ODT) 4 MG disintegrating tablet Take 1 tablet (4 mg) by mouth every 6 (six) hours as needed for Nausea, Starting Tue 03/24/2023, E-Rx      oseltamivir  (TAMIFLU ) 75 MG capsule Take 1 capsule (75 mg) by mouth 2 (two) times daily for 5 days, Starting Tue 03/24/2023, Until Sun 03/29/2023, E-Rx      pseudoephedrine  (SUDAFED) 30 MG tablet Take 30 mg by mouth every 4 (four) hours as needed for Congestion, Starting Tue 03/24/2023, E-Rx             ED Course:   5:01 PM- PO challenge passed. Discussed home care, reasons to return. All questions answered.  Discussed risk/benefits/alternatives of Tamiflu .  Patient would like to take it.    Provider Notes:   38 year old female with flulike illness, including diarrhea.  No vomiting.  Patient had a flu test 3 days ago, which was negative, however, today 's is positive.  Discussed treatment options, including symptomatic treatment and/or Tamiflu .  Patient would like to take Tamiflu .  Discussed reasons to return.  All questions answered.    Dr. Maylene is the primary emergency physician of record.       Diagnosis     Clinical Impression:   1. Influenza A        Treatment Plan:   ED Disposition       ED Disposition   Discharge    Condition   --    Date/Time   Tue Mar 24, 2023  5:01 PM    Comment   Laveda Demedeiros discharge to home/self care.    Condition at disposition: Stable                 This note was generated by the Epic EMR system/ Dragon speech recognition and may contain inherent errors or omissions not intended by the user. Grammatical errors, random word insertions, deletions and pronoun errors  are occasional consequences of this technology due to software limitations. Not all errors are caught or  corrected. If there are questions or concerns about the content of this note or information contained within the body of this dictation they should be addressed directly with the author for clarification.    _______________________________             [1]   No current facility-administered medications for this encounter.     Current Outpatient Medications   Medication Sig Dispense Refill    semaglutide  (Ozempic , 0.25 or 0.5 MG/DOSE,) 2 MG/3ML Inject 0.25 mg into the skin once a week      ACYCLOVIR  PO Take by mouth      albuterol  sulfate HFA (PROVENTIL ) 108 (90 Base) MCG/ACT inhaler Inhale 2 puffs into the lungs every 4 (four) hours as needed for Wheezing (cough) Dispense with spacer 18 g 0    ibuprofen  (ADVIL ) 600 MG tablet Take 1 tablet (600 mg) by mouth every 6 (six) hours as needed for Pain or Fever 20 tablet 0    ondansetron  (ZOFRAN -ODT) 4 MG disintegrating tablet Take 1 tablet (4 mg) by mouth every 6 (six) hours as needed for Nausea 8 tablet 0    oseltamivir  (TAMIFLU ) 75 MG capsule Take 1 capsule (75 mg) by mouth 2 (two) times daily for 5 days 10 capsule 0    pseudoephedrine  (SUDAFED) 30 MG tablet Take 30 mg by mouth every 4 (four) hours as needed for Congestion 18 tablet 0    rizatriptan  (MAXALT ) 10 MG tablet Take 1 tablet (10 mg) by mouth as needed for Migraine May repeat one additional dose in 2 hours if needed 9 tablet 5    Topiramate  ER 25 MG Capsule SR 24 hr Take 1 capsule (25 mg) by mouth daily 30 capsule 0    valACYclovir  HCL (VALTREX ) 500 MG tablet Take 1 tablet (500 mg) by mouth 2 (two) times daily     [2]   Past Surgical History:  Procedure Laterality Date    COSMETIC SURGERY      Lipoma removal    EXCISION CYST/LIPOMA      TONSILLECTOMY     [3]   Family History  Problem Relation Name Age of Onset    Hypertension Mother Chyann Ambrocio     Arthritis Mother Laurisa Sahakian     Obesity Mother Shineka Auble     Asthma Father Gia Lusher     Hypertension Father Claretta Kendra     Alcohol abuse Father  Ripley Lovecchio     Arthritis Father Kit Brubacher     Diabetes Father Charday Capetillo     Alcohol abuse Paternal Grandfather Claudean Slocumb     Early death Brother Layman Slocumb     Kidney failure Brother Layman Slocumb     Obesity Sister Oddis Slocumb     Obesity Sister Virgia Glad    [4]   Social History  Tobacco Use    Smoking status: Never    Smokeless tobacco: Never   Vaping Use    Vaping status: Never Used   Substance Use Topics    Alcohol use: Yes     Alcohol/week: 1.0 standard drink of alcohol     Types: 1 Glasses of wine per week     Comment: 2/month    Drug use: Never   [5]   Allergies  Allergen Reactions    Doxycycline Hives, Itching and Nausea And Vomiting    Penicillins Hives and Itching    Doxycycline Hives    Penicillins     Unknown [Other Drug]      Unknown eye drop - made infection worse         Maylene Elio PARAS, MD  03/25/23 1350

## 2023-03-25 LAB — LAB USE ONLY - URINE GRAY CULTURE HOLD TUBE

## 2023-03-30 ENCOUNTER — Encounter (INDEPENDENT_AMBULATORY_CARE_PROVIDER_SITE_OTHER): Payer: Self-pay

## 2023-03-31 ENCOUNTER — Telehealth (INDEPENDENT_AMBULATORY_CARE_PROVIDER_SITE_OTHER): Payer: Self-pay | Admitting: Surgery

## 2023-03-31 NOTE — Telephone Encounter (Signed)
 BENEFIT DETERMINATION ENCOUNTER WITH INSURANCE    Insurance Disclaimer: "A quote of benefits and/or authorization does not guarantee payment or verify eligibility. Payment of benefits are subject to all terms, conditions, limitations, and exclusions of the member's contract at time of service."    REFERENCE NUMBER: [id number] REPRESENTATIVE:  BRET D     PRIMARY:  [employer]    SURGEON: CUL OFFICE:  [facility] DATE OF CONSULT:    04/01/2023     MEMBER INFORMATION:  Tonya Camacho    CARRIER  [employer]   INSURANCE ID [id number]   SUBSCRIBER NAME DOLORAS, TELLADO MICHELLE   DOB 1985-04-25   STATUS ACTIVE   RENEWS 02/18/2024     EMPLOYER  [employer]       IN NETWORK STATUS OFFICE?  Y      IN NETWORK STATUS PROVIDER?  Y     LIMIT  ACCUMILATION  MET/NOT MET    INDIVIDUAL DEDUCTIBLE 500.00 0.00 NOT MET    INDIVIDUAL OUT OF POCKET 3500.00 180.00 NOT MET    FAMILY DEDUCTIBLE       FAMILY OUT OF POCKET         ______________________________________________________________________    BENEFITS YES/NO    MORBID OBESITY YES           Specialist Copay: Deductibles:   YES Co-Insurance:  80%          ____________________________________________________________________  BARIATRIC SURGERY COVERAGE DETAILS FOR INPATIENT  [id number]  DX: E66.01   CONTRACT  GUIDELINES/EXCLUSIONS:  NON NOTED    OTHER INDICATED CRITERIA/GUIDELINES:     Scope of Policy  This Clinical Policy Bulletin addresses obesity surgery.     Note: Most [employer] HMO and QPOS plans exclude coverage of surgical operations, procedures, or treatment of obesity unless approved by [employer].  Some [employer] plans entirely exclude coverage of surgical treatment of obesity. Please check benefit plan descriptions for details.    Medical Necessity  Roux-en-Y Gastric Bypass (RYGB), Laparoscopic Adjustable Silicone Gastric Banding (LASGB), Sleeve Gastrectomy, Biliopancreatic Diversion (BPD), Duodenal Switch (DS) Procedures, Single Anastomosis Duodenal-Ileal Switch (SADI-S), and  Sleeve Gastrectomy with Single Anastomosis Duodeno-Ileal Bypass (SIPS)  Open or laparoscopic short or long-limb Roux-en-Y gastric bypass (RYGB), open or laparoscopic sleeve gastrectomy, open or laparoscopic biliopancreatic diversion (BPD) with or without duodenal switch (DS), laparoscopic adjustable silicone gastric banding (LASGB), open or laparoscopic single anastomosis duodenal-ileal switch (SADI-S), OR, open or laparoscopic sleeve gastrectomy with single anastomosis duodeno-ileal bypass (SIPS) is considered medically necessary when the selection criteria listed below are met:    Must meet either a (adults) or b (adolescents):    For adults aged 63 years or older, presence of persistent severe obesity, documented in contemporaneous clinical records, defined as any of the following:     Body mass index (BMI) (see Appendix) exceeding 40 (or exceeding 37.5 for persons of Asian ancestry) measured prior to preoperative preparatory program; or  BMI greater than 35 (or exceeding 32.5 for persons of Asian ancestry) measured prior to preoperative preparatory program in conjunction with any of the following severe co-morbidities:    Clinically significant obstructive sleep apnea (i.e., person meets the criteria for treatment of obstructive sleep apnea set forth in CPB 0004 - Obstructive Sleep Apnea in Adults); or  Coronary heart disease, with objective documentation (by exercise stress test, radionuclide stress test, pharmacologic stress test, stress echocardiography, CT angiography, coronary angiography, heart failure or prior myocardial infarction); or  Medically refractory hypertension (blood pressure greater than  140 mmHg systolic and/or 90 mmHg diastolic despite concurrent use of 3 anti-hypertensive agents of different classes); or   Type 2 diabetes mellitus; or   Nonalcoholic steatohepatitis (NASH)Footnote1*; or  For adolescents who have completed bone growth (generally age of 71 in girls and age of 109 in boys),  presence of obesity with BMI exceeding 40;    Footnote1* Note: NASH determination may include either a liver biopsy or the presence of advanced hepatic fibrosis identified by FibroScan, FibroTest-ActiTest, magnetic resonance elastography, or Enhanced Liver Fibrosis (ELF) test (see also: CPB 9309 - Noninvasive Tests for Hepatic Fibrosis).    Note: Concurrent hiatus hernia repair in bariatric surgery is considered incidental and not separately reimbursable.    Member has attempted weight loss in the past without successful long-term weight reduction; and    Member has participated in an intensive multicomponent behavioral intervention designed to help participants achieve or maintain weight loss through a combination of dietary changes and increased physical activity. This intensive multicomponent behavioral intervention must meet all of the following criteria:    Member's participation in an intensive multicomponent behavioral intervention must be documented in the medical record. Records must document compliance with the program. For members who participate in an intensive multicomponent behavioral intervention (e.g., [facility], Medi[employer], [facility], OptiFast, Weight Watchers), program records documenting the member's participation and progress may substitute for medical records. Program must be intensive (12 or more sessions on separate dates over any duration of time) and occur within 2 years prior to surgery. Note: Programs may extend beyond two years if the final session occurred within two years prior to surgery; and   Intensive multicomponent behavioral intervention may be in-person or remote, and may be group or individual-based; and  The intensive multicomponent behavioral intervention program must have components focusing on nutrition, physical activity, and behavioral modification (e.g., self-monitoring, identifying barriers, and problem solving). The multicomponent behavioral intervention  program may be supervised by behavioral therapists, psychologists, registered dietitians, exercise physiologists, lifestyle coaches or other staff; and  For members who have an active substance abuse disorder, or have a history of eating disorder (in addition to obesity) or severe psychiatric disturbance (schizophrenia, borderline personality disorder, suicidal ideation, severe depression) or who are currently under the care of a psychologist/psychiatrist, pre-operative psychological clearance is necessary in order to exclude members who are unable to provide informed consent or who are unable to comply with the pre- and post-operative regimen. Note: The presence of depression due to obesity is not normally considered a contraindication to obesity surgery.    Vertical Banded Gastroplasty (VBG)  Open or laparoscopic vertical banded gastroplasty (VBG) is considered medically necessary for members who meet the selection criteria for obesity surgery and who are at increased risk of adverse consequences of a RYGB due to the presence of any of the following co-morbid medical conditions:    Demonstrated complications from extensive adhesions involving the intestines from prior major abdominal surgery, multiple minor surgeries, or major trauma; or  Hepatic cirrhosis with elevated liver function tests; or  Inflammatory bowel disease (Crohn's disease or ulcerative colitis); or  Poorly controlled systemic disease (American Society of Anesthesiology (ASA) Class IV) (see Appendix); or  Radiation enteritis.  [employer] considers VBG experimental, investigational, or unproven when medical necessity criteria are not met.    PRIOR AUTHORIZATION REQUIRED:   YES    PRIOR AUTH CONTACT INFORMATION:     NUTRITION PROGRAM REQUIREMENT:   12 SESSIONS / PSYCH EVAL    6 MONTHS NICOTINE  FREE REQUIREMENT FOR BCBS CAPITAL BLUE, [place], FED BCBS    BRS ENROLLEMENT REQUIRED: Tel:[phone] (41-Month Program Required): n/a    AHP ENROLLEMENT  REQUIRED:Tel: [phone] (63-Month Program Required): n/a    INPATIENT COPAY:  0.00    CO-INSURANCE:  80%    DEDUCTIBLE:  APPLIES    _______________________________________________________________________  NUTRITION COUNSELING BENEFITS    Nutrition Counseling Benefits? YES Nutrition Copayment: 0.00 Co-Insurance:  100%     Visits:  10 Used:       Deductible:  DOES NOT APPLY       97802  (MEDICAL NUTRITION INITIAL INDIVIDUAL) YES E66.01(MORBID OBESITY)  SX, SADI, TORe  ESG: NO    EGD PRIOR AUTH: NO YES   97803  (MEDICAL NUTRITION, SUBSEQUENT, INDIVIDUAL) YES E66.9(BMI CLASS II W/CO-MORBIDITY)    [id number]  (MEDICAL NUTRITION GROUP) YES Z71.3(DIETARY COUNSELING)    [phone]  (MEDICAL NUTRITION INITIAL, INDIVIDUAL, RE-ASSESSMENT)  Z98.84(S/P) BARIATRIC SURGERY    [id number]  (MEDICAL NUTRITION GROUP, RE-ASSESSMENT)  Prior auth required NO   [id number]  (GASTRID BAND ADJUSTMENT: DX: Z46.51)  TEL:                         FAX:    ________________________________________________________________________  TELEHEALTH BENEFITS INCLUDING VIRTUAL/PHONE ENCOUNTERS       Benefits? YES  Co-Insurance:      Deductible:

## 2023-04-01 ENCOUNTER — Telehealth (INDEPENDENT_AMBULATORY_CARE_PROVIDER_SITE_OTHER): Payer: No Typology Code available for payment source | Admitting: Surgery

## 2023-04-01 ENCOUNTER — Encounter (INDEPENDENT_AMBULATORY_CARE_PROVIDER_SITE_OTHER): Payer: Self-pay | Admitting: Surgery

## 2023-04-01 VITALS — Ht 68.0 in | Wt 259.0 lb

## 2023-04-01 DIAGNOSIS — F419 Anxiety disorder, unspecified: Secondary | ICD-10-CM | POA: Insufficient documentation

## 2023-04-01 DIAGNOSIS — E66813 Obesity, class 3: Secondary | ICD-10-CM

## 2023-04-01 DIAGNOSIS — Z6841 Body Mass Index (BMI) 40.0 and over, adult: Secondary | ICD-10-CM

## 2023-04-01 DIAGNOSIS — G4733 Obstructive sleep apnea (adult) (pediatric): Secondary | ICD-10-CM | POA: Insufficient documentation

## 2023-04-01 DIAGNOSIS — F32A Depression, unspecified: Secondary | ICD-10-CM | POA: Insufficient documentation

## 2023-04-01 NOTE — Progress Notes (Signed)
 Drs. Tonya Camacho, Tonya Camacho, and Tonya Camacho  [address]   [place], [place] [address]   [phone]   [phone]    [address]  [address]  [place], [place] [address]  [phone]   [phone]      New Patient Consult  Verbal consent has been obtained from the patient to conduct a zoom video visit encounter due to difficulty of traveling to the clinic. Patient is located in [place]  for this visit.    Assessment:  Morbid obesity, BMI 39.38  Obstructive sleep apnea  Anxiety  Depression    Plan:  In brief,  [name]  is a 38 y.o. year old morbidly obese patient with comorbid conditions who has expressed an interest in robotic/laparoscopic SG-Sleeve. Patient was given a preoperative testing check list which includes dietary and psychiatric counseling for behavioral modification and medical/cardiac clearance (if indicated). Patient will be seen in the office on multiple occasions preoperatively.   In the best interest of this patients overall success, we would appreciate your assistance with changing medications to chewable, crushable and/or liquid form for postoperative period and necessary preoperative clearances.    An omentopexy may need to be performed to stabilize the stomach wall.  This can prevent gastric twist which is a functional cause of gastric stenosis.  The remnant stomach is fixed to the gastrosplenic and gastrocolic ligaments.  The procedure can also aid to decrease the rate of gastroesophageal reflux, postoperative food intolerance and gastric leaks. This procedure is intended to achieve better treatment outcomes in terms decreased risk of complications from the gastric sleeve.      History of Present Illness:  I had a pleasure of seeing Tonya Camacho in the office in consultation for possible bariatric surgery to resolve and treat morbid obesity and comorbid conditions. Patient has tried and failed multiple previous  attempts at conservative weight loss programs.     Bariatric comorbidities present:   Obstructive sleep apnea    We had a long discussion and thoroughly reviewed past medical and surgical history. she has attended an informational seminar/webinar and was given a booklet summarizing weight loss surgery options, risks and results. The surgical options discussed include robotic/laparoscopic Roux-en Y gastric bypass, laparoscopic adjustable gastric banding and robotic/laparoscopic sleeve gastrectomy. All questions and concerns were addressed in detail.       The following portions of the patient's history were reviewed and updated as appropriate: allergies, current medications, past family history, past medical history, past social history, past surgical history, and problem list.  CURRENT PROBLEM LIST: Problem List[1]  PAST MEDICAL HISTORY: Medical History[2]  PAST SURGICAL HISTORY: Past Surgical History[3]  FAMILY HISTORY:  Family History[4]  SOCIAL HISTORY:   Social History     Socioeconomic History    Marital status: Single     Spouse name: None    Number of children: None    Years of education: None    Highest education level: None   Occupational History    None   Tobacco Use    Smoking status: Never    Smokeless tobacco: Never   Vaping Use    Vaping status: Never Used   Substance and Sexual Activity    Alcohol use: Yes     Alcohol/week: 1.0 standard drink of alcohol     Types: 1 Glasses of wine per week     Comment: 2/month    Drug use: Never    Sexual  activity: Yes     Partners: Male     Birth control/protection: None   Other Topics Concern    Dietary supplements / vitamins Not Asked    Anesthesia problems Not Asked    Blood thinners Not Asked    Pregnant Not Asked    Future Children Not Asked    Number of Pregnancies? Not Asked    Number of children Not Asked    Miscarriages / Abortions? Not Asked    Eats large amounts No    Excessive Sweets No    Skips meals Yes    Eats excessive starches No    Snacks or grazes  Yes    Emotional eater Yes    Eats fried food Yes    Eats fast food Yes    Diet Center No    [employer] No    [facility] Weight [facility] No    Nutri-System No    Opti-Fast / Medi-Fast No    Overeaters Anonymous No    [facility] No    TOPS No    Weight Watchers Yes    Atkins No    Binging / Purging No    Calorie Counting Yes    Fasting No    High Protein No    Low Carb No    Low Fat No    [facility] Diet No    Slim Fast No    [place] No    Stationary cycle or treadmill No    Gym/fitness Classes No    Home exercise/video No    Swimming No    Weight training No    Walking or running Yes    Hospitalization No    Hypnosis No    Physical therapy No    Psychological therapy No    Residential program No    Acutrim No    [place] No    Contrave No    Dexatrim No    Diethylpropion No    Fastin No    Fen - Phen No    Ionamin / Adipex No    Phentermine No    Qsymia No    Prozac No    Saxenda No    Topamax  No    Wellbutrin No    Xenical (Orlistat, Alli) No    Other Med Yes     Comment: Semaglutide  Compound w/ B12    No impairment No    Walks with cane/crutch No    Requires a wheelchair No    Bedridden No    Are you currently being treated for depression? No    Do you snore? Yes    Are you receiving any medical or psychological services? Yes    Do you ever wake up at night gasping for breath? No    Do you have or have you been treated for an eating disorder? No    Anyone ever told you that you stop breathing while asleep? Yes    Do you exercise regularly? No    Have you or family member ever have trouble with anesthesia? No   Social History Narrative    ** Merged History Encounter **          Social Drivers of Health     Financial Resource Strain: Low Risk  (01/05/2023)    Overall Financial Resource Strain (CARDIA)     Difficulty of Paying Living Expenses: Not very hard   Food Insecurity: No Food Insecurity (02/08/2023)  Hunger Vital Sign     Worried About Running Out of Food in the Last Year: Never true     Ran Out  of Food in the Last Year: Never true   Transportation Needs: No Transportation Needs (02/08/2023)    PRAPARE - Therapist, Art (Medical): No     Lack of Transportation (Non-Medical): No   Physical Activity: Inactive (01/05/2023)    Exercise Vital Sign     Days of Exercise per Week: 0 days     Minutes of Exercise per Session: 0 min   Stress: Stress Concern Present (01/05/2023)    [employer] of Occupational Health - Occupational Stress Questionnaire     Feeling of Stress : Rather much   Social Connections: Moderately Isolated (01/05/2023)    Social Connection and Isolation Panel [NHANES]     Frequency of Communication with Friends and Family: More than three times a week     Frequency of Social Gatherings with Friends and Family: Never     Attends Religious Services: Never     Database Administrator or Organizations: Yes     Attends Engineer, Structural: More than 4 times per year     Marital Status: Never married   Intimate Partner Violence: Not At Risk (02/08/2023)    Humiliation, Afraid, Rape, and Kick questionnaire     Fear of Current or Ex-Partner: No     Emotionally Abused: No     Physically Abused: No     Sexually Abused: No   Housing Stability: Low Risk  (02/08/2023)    Housing Stability Vital Sign     Unable to Pay for Housing in the Last Year: No     Number of Times Moved in the Last Year: 0     Homeless in the Last Year: No    (Does not refresh)    TOBACCO HISTORY: Tobacco Use History[5]  Tobacco Dependence:  Tobacco Use History[6]  ALCOHOL HISTORY:   Social History     Substance and Sexual Activity   Alcohol Use Yes    Alcohol/week: 1.0 standard drink of alcohol    Types: 1 Glasses of wine per week    Comment: 2/month     DRUG HISTORY:   Social History     Substance and Sexual Activity   Drug Use Never     CURRENT HOSPITAL MEDICATIONS: Current Medications[7]  CURRENT OUTPATIENT MEDICATIONS: Medications Taking[8]  ALLERGIES: Allergies[9]       Review of  Systems  Constitutional: negative for fevers, night sweats  Respiratory: negative for SOB, cough  Cardiovascular: negative for chest pain, palpitations  Gastrointestinal: negative for abd pain  Genitourinary:negative for hematuria, dysuria  Musculoskeletal:negative for bone pain, myalgias and stiff joints  Neurological: negative for dizziness, gait problems, headaches and memory problems  Behavioral/Psych: negative for fatigue, loss of interest in favorite activities, separation anxiety and sleep disturbance  Endocrine: negative for temperature intolerance  Integumentary: No rashes, no skin infections    Objective:    Ht 5' 8   Wt 259 lb   LMP 03/15/2023 (Exact Date)   BMI 39.38 kg/m   Body mass index is 39.38 kg/m.  Weight: 259 lb       General Appearance:    Alert, cooperative, no distress, obese   Head:    Normocephalic, without obvious abnormality, atraumatic   Eyes:    No scleral icterus          Data Review:  Admission on 03/24/2023, Discharged on 03/24/2023   Component Date Value Ref Range Status    SARS-CoV-2 (COVID-19) RNA 03/24/2023 Not Detected  Not Detected Final    Influenza A RNA 03/24/2023 Detected (A)  Not Detected Final    Influenza B RNA 03/24/2023 Not Detected  Not Detected Final    Urine Color 03/24/2023 Yellow  Colorless, Straw or Yellow Final    Urine Clarity 03/24/2023 Hazy  Clear, Hazy Final    Urine Specific Gravity 03/24/2023 1.038 (H)  1.001 - 1.035 Final    Urine pH 03/24/2023 6.0  5.0 - 8.0 Final    Urine Leukocyte Esterase 03/24/2023 Negative  Negative Final    Urine Nitrite 03/24/2023 Negative  Negative Final    Urine Protein 03/24/2023 30= 1+ (A)  Negative Final    Urine Glucose 03/24/2023 Negative  Negative Final    Urine Ketones 03/24/2023 Trace (A)  Negative mg/dL Final    Urine Urobilinogen 03/24/2023 6.0 (A)  0.2 - 2.0 mg/dL Final    Urine Bilirubin 03/24/2023 Negative  Negative Final    Urine Blood 03/24/2023 Negative  Negative Final    RBC, UA 03/24/2023 0-2  0 - 5  /hpf Final    Urine WBC 03/24/2023 0-5  None Seen, 0-5 /hpf Final    Patient without significant pyuria (i.e. >10 WBC/HPF).  Urine culture not indicated.    Urine Squamous Epithelial Cells 03/24/2023 11-25 (A)  0 - 5 /hpf Final    Urine Mucus 03/24/2023 Present (A)  None Final    Extra Tube 03/24/2023 Hold for add-ons.   Final    Auto-resulted.    Urine HCG Qualitative 03/24/2023 Negative  Negative Final   Admission on 02/08/2023, Discharged on 02/08/2023   Component Date Value Ref Range Status    Glucose 02/08/2023 84  70 - 100 mg/dL Final    ADA Guidelines for diabetes mellitus:  Fasting: Equal to or greater than 126 mg/dL  Random: Equal to or greater than 200 mg/dL    BUN 87/77/7975 10  7 - 21 mg/dL Final    Creatinine 87/77/7975 0.9  0.4 - 1.0 mg/dL Final    Calcium 87/77/7975 9.4  8.5 - 10.5 mg/dL Final    Sodium 87/77/7975 138  135 - 145 mEq/L Final    Potassium 02/08/2023 4.4  3.5 - 5.3 mEq/L Final    Chloride 02/08/2023 107  99 - 111 mEq/L Final    CO2 02/08/2023 22  17 - 29 mEq/L Final    Anion Gap 02/08/2023 9.0  5.0 - 15.0 Final    Calculated Anion Gap = Na - (Cl + CO2)  Interpret with caution; calculated Anion Gap may not reflect patient's true clinical status.     This is a calculated value and platform-dependent. A value >12.0 has been recommended for the management of Hyperglycemic Crises: Diabetic Ketoacidosis and Hyperglycemic Hyperosmolar State.Med Clin [place]. 2017;101(3):587-606.doi,10.1016/j.mcna.2016.12.011    GFR 02/08/2023 >60.0  >=60.0 mL/min/1.73 m2 Final    Reported eGFR is based on the CKD-EPI 2021 equation that does not use a race coefficient. This equation is used for all patients (both Black and non-Black), and old and new GFR estimates may differ by more than 10%, particularly at higher eGFRcr values and at younger ages. For eGFR of 45-59 mL/min/1.73 m2 with uACR <30 mg/g, please check NKF KDOQI and KDIGO guidelines:     https://www.kidney.org/professionals/kdoqi    GFR estimates  are unreliable in patient with:     Rapidly changing kidney function  or recent dialysis, extreme age, body size or body composition (obesity, severe malnutrition). Abnormal muscle mass (limb amputation, muscle wasting). In these patients, alternative determinations of GFR should be obtained.    PT 02/08/2023 11.9  10.1 - 12.9 sec Final    INR 02/08/2023 1.1  0.9 - 1.1 Final    Recommended Ranges for Protime INR:    2.0-3.0 for most medical and surgical thromboembolic states    2.5-3.5 for artificial heart valves INR result may not represent exact Warfarin dosing level during the transition period from Heparin to Warfarin therapy.  Results should be interpreted based on current anticoagulant therapy and patient's clinical presentation.    WBC 02/08/2023 8.43  3.10 - 9.50 x10 3/uL Final    Hemoglobin 02/08/2023 13.9  11.4 - 14.8 g/dL Final    Hematocrit 87/77/7975 43.1  34.7 - 43.7 % Final    Platelet Count 02/08/2023 281  142 - 346 x10 3/uL Final    MPV 02/08/2023 10.4  8.9 - 12.5 fL Final    RBC 02/08/2023 5.05  3.90 - 5.10 x10 6/uL Final    MCV 02/08/2023 85.3  78.0 - 96.0 fL Final    MCH 02/08/2023 27.5  25.1 - 33.5 pg Final    MCHC 02/08/2023 32.3  31.5 - 35.8 g/dL Final    RDW 87/77/7975 15  11 - 15 % Final    nRBC % 02/08/2023 0.0  <=0.0 /100 WBC Final    Absolute nRBC 02/08/2023 0.00  <=0.00 x10 3/uL Final    Preliminary Absolute Neutrophil Co* 02/08/2023 5.28  1.10 - 6.33 x10 3/uL Final    [facility] is a preliminary result.  Final result may be different following review of the peripheral blood smear.     Neutrophils % 02/08/2023 62.6  Not Established % Final    Lymphocytes % 02/08/2023 30.4  Not Established % Final    Monocytes % 02/08/2023 5.0  Not Established % Final    Eosinophils % 02/08/2023 1.4  Not Established % Final    Basophils % 02/08/2023 0.4  Not Established % Final    Immature Granulocytes % 02/08/2023 0.2  Not Established % Final    Absolute Neutrophils 02/08/2023 5.28  1.10 - 6.33 x10 3/uL  Final    Absolute Lymphocytes 02/08/2023 2.56  0.42 - 3.22 x10 3/uL Final    Absolute Monocytes 02/08/2023 0.42  0.21 - 0.85 x10 3/uL Final    Absolute Eosinophils 02/08/2023 0.12  0.00 - 0.44 x10 3/uL Final    Absolute Basophils 02/08/2023 0.03  0.00 - 0.08 x10 3/uL Final    Absolute Immature Granulocytes 02/08/2023 0.02  0.00 - 0.07 x10 3/uL Final    hCG, Quantitative 02/08/2023 <2.4  mIU/mL Final   Office Visit on 01/06/2023   Component Date Value Ref Range Status    Chlamydia trachomatis DNA 01/06/2023 Negative  Negative Final    Testing performed by real-time polymerase chain reaction (PCR) for the direct detection of Chlamydia trachomatis DNA.  Results depend on adequate specimen collection, absence of inhibitors, and sufficient DNA for detection.  This test should not be used to determine therapeutic success as nucleic acids may be present, and detected, after thearpy.  A negative result does not necessarily rule out infection.  Specimens containing mucus, gels, lubricants, or creams may give invalid or false negative results.      Neisseria gonorrhoeae DNA 01/06/2023 Negative  Negative Final    Testing performed by real-time polymerase chain reaction (PCR) for the direct  detection of Neisseria gonorrhoeae DNA.  Results depend on adequate specimen collection, absence of inhibitors, and sufficient DNA for detection.  This test should not be used to determine therapeutic success as nucleic acids may be present, and detected, after thearpy.  A negative result does not necessarily rule out infection.  Specimens containing mucus, gels, lubricants, or creams may give invalid or false negative results.     Trichomonas vaginalis DNA 01/06/2023 Negative  Negative Final    Testing performed by real-time polymerase chain reaction (PCR) for the direct detection of Trichomonas vaginalis DNA.  Test results should be interpreted in conjunction with other clinical and laboratory data available to the clinician.  Results  depend on adequate specimen collection, absence of inhibitors, and sufficient DNA for detection.  This test should not be used to determine therapeutic success as nucleic acids may be present, and detected, after thearpy.  A negative result does not necessarily rule out infection.  Specimens containing mucus, gels, lubricants, or creams may give invalid or false negative results.      Bacterial vaginosis marker DNA 01/06/2023 Detected (A)  Not Detected Final    Comment: This panel utilizes real-time PCR for direct detection of DNA targets from bacteria associated with bacterial vaginosis. Bacterial vaginosis markers include Lactobacillus spp. (L. crispatus and L. jensenii), Gardnerella vaginalis, Atopobium vaginae, Bacterial Vaginosis Associated Bacteria-2 (BVAB-2), and Megasphaera-1. Bacterial vaginosis markers are not reported individually. A result of Detected indicates the presence of marker combinations related to bacterial vaginosis. Results from the panel should be interpreted in conjunction with clinical information as bacterial vaginosis marker combinations that generate positive results can be present as commensal organisms. A positive test result does not necessarily indicate the presence of viable organisms and the test cannot be used to assess therapeutic success or failure since target nucleic acids may persist following therapy. Results depend on adequate specimen collection, absence of inhibitors, and sufficient DNA for detection.                            Lubricants and creams/gels may cause assay inhibition.    Candida group DNA 01/06/2023 Not Detected  Not Detected Final     This panel utilizes real-time PCR for direct detection of DNA targets from Candida species associated with vulvovaginal candidiasis.  Candida group DNA includes C. albicans, C. tropicalis, C. parapsilosis, and C. dubliniensis. Candida group DNA results are not reported individually. Results from the panel should be  interpreted in conjunction with clinical information as Candida species that generate positive results can be present as commensal organisms. A positive test result does not necessarily indicate the presence of viable organisms and the test cannot be used to assess therapeutic success or failure since target nucleic acids may persist following therapy. Results depend on adequate specimen collection, absence of inhibitors, and sufficient DNA for detection.  Lubricants and creams/gels may cause assay inhibition.    Candida glabrata DNA 01/06/2023 Not Detected  Not Detected Final    Candida krusei DNA 01/06/2023 Not Detected  Not Detected Final     .  Radiology Results   No results found.      Charmaine Hausen, MD  Bariatric Surgery  Cell [phone]  Office [phone]         [1]   Patient Active Problem List  Diagnosis    Severe obesity (BMI 35.0-39.9) with comorbidity    OSA (obstructive sleep apnea)    Anxiety    Depression   [  2]   Past Medical History:  Diagnosis Date    Anxiety     Depression     Environmental allergies     Gastroesophageal reflux disease     no longer has GERD per recent EGD    Obesity     Pneumonia     Seasonal allergic rhinitis     Sleep apnea     does not use cpap    STD (sexually transmitted disease)     Genital Herpes   [3]   Past Surgical History:  Procedure Laterality Date    COSMETIC SURGERY      Lipoma removal    EXCISION CYST/LIPOMA      TONSILLECTOMY     [4]   Family History  Problem Relation Name Age of Onset    Hypertension Mother Vaneta Hammontree     Arthritis Mother Marianela Mandrell     Obesity Mother Dorraine Ellender     Asthma Father Destaney Sarkis     Hypertension Father Milca Sytsma     Alcohol abuse Father Khailee Mick     Arthritis Father Tajah Noguchi     Diabetes Father Marca Gadsby     Alcohol abuse Paternal Grandfather Claudean Slocumb     Early death Brother Layman Slocumb     Kidney failure Brother Layman Slocumb     Obesity Sister Oddis Slocumb     Obesity  Sister Virgia Glad    [5]   Social History  Tobacco Use   Smoking Status Never   Smokeless Tobacco Never   [6]   Social History  Tobacco Use   Smoking Status Never   Smokeless Tobacco Never   [7]   Current Outpatient Medications   Medication Sig Dispense Refill    albuterol  sulfate HFA (PROVENTIL ) 108 (90 Base) MCG/ACT inhaler Inhale 2 puffs into the lungs every 4 (four) hours as needed for Wheezing (cough) Dispense with spacer 18 g 0    ibuprofen  (ADVIL ) 600 MG tablet Take 1 tablet (600 mg) by mouth every 6 (six) hours as needed for Pain or Fever 20 tablet 0    ondansetron  (ZOFRAN -ODT) 4 MG disintegrating tablet Take 1 tablet (4 mg) by mouth every 6 (six) hours as needed for Nausea 8 tablet 0    pseudoephedrine  (SUDAFED) 30 MG tablet Take 30 mg by mouth every 4 (four) hours as needed for Congestion 18 tablet 0    rizatriptan  (MAXALT ) 10 MG tablet Take 1 tablet (10 mg) by mouth as needed for Migraine May repeat one additional dose in 2 hours if needed 9 tablet 5    valACYclovir  HCL (VALTREX ) 500 MG tablet Take 1 tablet (500 mg) by mouth 2 (two) times daily      ACYCLOVIR PO Take by mouth      semaglutide  (Ozempic , 0.25 or 0.5 MG/DOSE,) 2 MG/3ML Inject into the skin once a week Compounded drug- not ozempic       SEMAGLUTIDE  PO Take by mouth Compound w/ b12      Topiramate  ER 25 MG Capsule SR 24 hr Take 1 capsule (25 mg) by mouth daily (Patient not taking: Reported on 04/01/2023) 30 capsule 0     No current facility-administered medications for this visit.   [8]   Outpatient Medications Marked as Taking for the 04/01/23 encounter (Telemedicine Visit) with Marge Charmaine CROME, MD   Medication Sig Dispense Refill    albuterol  sulfate HFA (PROVENTIL ) 108 (90 Base) MCG/ACT inhaler Inhale 2 puffs into the lungs every 4 (four)  hours as needed for Wheezing (cough) Dispense with spacer 18 g 0    ibuprofen  (ADVIL ) 600 MG tablet Take 1 tablet (600 mg) by mouth every 6 (six) hours as needed for Pain or Fever 20 tablet 0     ondansetron  (ZOFRAN -ODT) 4 MG disintegrating tablet Take 1 tablet (4 mg) by mouth every 6 (six) hours as needed for Nausea 8 tablet 0    pseudoephedrine  (SUDAFED) 30 MG tablet Take 30 mg by mouth every 4 (four) hours as needed for Congestion 18 tablet 0    rizatriptan  (MAXALT ) 10 MG tablet Take 1 tablet (10 mg) by mouth as needed for Migraine May repeat one additional dose in 2 hours if needed 9 tablet 5    valACYclovir  HCL (VALTREX ) 500 MG tablet Take 1 tablet (500 mg) by mouth 2 (two) times daily     [9]   Allergies  Allergen Reactions    Doxycycline Hives, Itching and Nausea And Vomiting    Penicillins Hives and Itching    Doxycycline Hives    Penicillins     Unknown [Other Drug]      Unknown eye drop - made infection worse

## 2023-04-02 ENCOUNTER — Other Ambulatory Visit (HOSPITAL_BASED_OUTPATIENT_CLINIC_OR_DEPARTMENT_OTHER): Payer: Self-pay | Admitting: Surgery

## 2023-04-02 DIAGNOSIS — G4733 Obstructive sleep apnea (adult) (pediatric): Secondary | ICD-10-CM

## 2023-04-07 ENCOUNTER — Other Ambulatory Visit (INDEPENDENT_AMBULATORY_CARE_PROVIDER_SITE_OTHER): Payer: No Typology Code available for payment source

## 2023-04-07 ENCOUNTER — Encounter: Payer: Self-pay | Admitting: Neurology

## 2023-04-07 ENCOUNTER — Ambulatory Visit: Payer: No Typology Code available for payment source | Attending: Neurology | Admitting: Neurology

## 2023-04-07 VITALS — BP 120/84 | HR 86 | Temp 98.0°F | Resp 16 | Ht 68.0 in | Wt 263.0 lb

## 2023-04-07 DIAGNOSIS — G4733 Obstructive sleep apnea (adult) (pediatric): Secondary | ICD-10-CM

## 2023-04-07 DIAGNOSIS — G43E09 Chronic migraine with aura, not intractable, without status migrainosus: Secondary | ICD-10-CM

## 2023-04-07 MED ORDER — RIZATRIPTAN BENZOATE 10 MG PO TABS
10.0000 mg | ORAL_TABLET | ORAL | 5 refills | Status: DC | PRN
Start: 2023-04-07 — End: 2023-11-18

## 2023-04-07 MED ORDER — RIBOFLAVIN 400 MG PO CAPS
400.0000 mg | ORAL_CAPSULE | Freq: Every day | ORAL | 3 refills | Status: AC
Start: 2023-04-07 — End: ?

## 2023-04-07 MED ORDER — MAGNESIUM 400 MG PO TABS
400.0000 mg | ORAL_TABLET | Freq: Every day | ORAL | 3 refills | Status: AC
Start: 2023-04-07 — End: ?

## 2023-04-07 MED ORDER — TOPIRAMATE 25 MG PO TABS
50.0000 mg | ORAL_TABLET | Freq: Two times a day (BID) | ORAL | 3 refills | Status: DC
Start: 2023-04-07 — End: 2023-06-23

## 2023-04-07 NOTE — Patient Instructions (Signed)
 Topiramate Morning Night For     25mg  2 weeks    25mg  25mg  2 weeks    25mg  50mg  2 weeks    50mg  50mg 

## 2023-04-07 NOTE — Progress Notes (Signed)
 Subjective:           Patient ID: Shaquera Ansley is a 38 y.o. female here for Migraine      HPI    38 y.o. female with PMH depression/anxiety, mild OSA here for further evaluation of migraines.    Headache hx: since 2018, worse since Sept 2024 - frequency/intensity  Location: right frontal  Described as throbbing  Occurs: 2-3/mth <-- few times/year  Duration: 1-4 days  Intensity: 10/10 <-- 8/10  Aura: pounding/nausea  Associated with dizziness, diaphoresis, nausea, photo/phonophobia, tingling in right toes  Denies rhinorrhea, lacrimation, conj injection, weakness  Exacerbating factors: sneezing (not coughing, valsalva)  Alleviating factors: lays in cool area  Sleep: 9.5 hours, mild OSA on home sleepy study (not using CPAP machine)  Mood: increase stress with work  Caffeine : 2 sodas/day  OTC meds: excedrin few times/week  Current medications: imitrex (mild relief), never stated ?Maxalt ,   - never started Topiramate  (not approved)   Failed medications:     Review of Systems All systems were reviewed and were negative except as described in the HPI.    Current/Home Medications    ACYCLOVIR PO    Take by mouth    ALBUTEROL  SULFATE HFA (PROVENTIL ) 108 (90 BASE) MCG/ACT INHALER    Inhale 2 puffs into the lungs every 4 (four) hours as needed for Wheezing (cough) Dispense with spacer    BENZONATATE (TESSALON) 200 MG CAPSULE        IBUPROFEN  (ADVIL ) 600 MG TABLET    Take 1 tablet (600 mg) by mouth every 6 (six) hours as needed for Pain or Fever    ONDANSETRON  (ZOFRAN -ODT) 4 MG DISINTEGRATING TABLET    Take 1 tablet (4 mg) by mouth every 6 (six) hours as needed for Nausea    PSEUDOEPHEDRINE  (SUDAFED) 30 MG TABLET    Take 30 mg by mouth every 4 (four) hours as needed for Congestion    RIZATRIPTAN  (MAXALT ) 10 MG TABLET    Take 1 tablet (10 mg) by mouth as needed for Migraine May repeat one additional dose in 2 hours if needed    SEMAGLUTIDE  (OZEMPIC , 0.25 OR 0.5 MG/DOSE,) 2 MG/3ML    Inject into the skin once a week  Compounded drug- not ozempic     SEMAGLUTIDE  PO    Take by mouth Compound w/ b12    TOPIRAMATE  ER 25 MG CAPSULE SR 24 HR    Take 1 capsule (25 mg) by mouth daily    VALACYCLOVIR  HCL (VALTREX ) 500 MG TABLET    Take 1 tablet (500 mg) by mouth 2 (two) times daily       The following portions of the patient's history were reviewed and updated as appropriate: allergies, current medications, past family history, past medical history, past social history, past surgical history and problem list.  No pertinent family history, except what is stated in HPI.          Objective:      Physical Exam Neurological Exam    On exam, the patient's mental status appeared normal. Speech showed appropriate content, was fluent, and the patient followed commands. Attention and concentration appeared normal. Cranial nerves showed, full extraocular movements, midline tongue, and a symmetric face. Motor exam showed good movement in all extremities without obvious focality or weakness. No pronator drift. Sensation was intact to light touch per patient.  No tremors.  RAM/FFM intact.  FNF intact.  Gait was stable   Fundi: poorly visualized     Imaging/Testing:  01/2022 MRI brain wnl      Assessment:         38 y.o. female with PMH of depression/anxiety, mild OSA here for further evaluation of migraines.        Plan:      - recommend Magnesium  400mg  and Riboflavin  400mg  supplementation for headache prevention  - then recommend trial of Topiramate   up to 50mg  BID  - trial of Maxalt  PRN  - if this fails, recommend started Ubrelvy  -Patient was counseled to follow the SEEDS for success in headache management, including Sleep hygiene, Exercising regularly, Eating healthy and regular meals, Drinking water , keeping a headache Diary, and Stress reduction.     Barrie Sigmund, MD

## 2023-04-08 ENCOUNTER — Telehealth (INDEPENDENT_AMBULATORY_CARE_PROVIDER_SITE_OTHER): Payer: Self-pay

## 2023-04-08 NOTE — Progress Notes (Signed)
 Pre Op #1    Tonya Camacho is a 38 y.o. year old female  S (Subjective): Pt actively participated in 1 on 1 conversation and was pleased with weight this visit.  Pt continues to be interested in [facility] to improve co-morbid conditions.     O (Objective):    Wt Readings from Last 10 Encounters:   04/07/23 263 lb   04/01/23 259 lb   03/24/23 264 lb 3.2 oz   03/19/23 264 lb 6.4 oz   02/08/23 270 lb   01/06/23 277 lb   08/09/20 243 lb        Went over strength training and aerobic training with patient.  Prescribed exercise recommendations prior to surgery.  Went over target heart rate zone and RPE scale to measure exercise. Talked about how many steps they should try and reach each day.   Went into detail and gave handouts on all information    Went over post op exercise guidelines and answered all questions.    30 minutes was spent educating patient     Exercise and Physical activity Goals:  1.150 minutes of moderate intensity aerobic exercise per week and resistance training 2x per week.

## 2023-04-13 NOTE — Progress Notes (Signed)
 CLEARANCE STATUS: cleared FOR BARIATRIC SURGERY    S:   Tonya Camacho is a pleasant 38 y.o. female with morbid obesity presenting for initial nutrition evaluation for possible bariatric surgery. Pt interested in SG surgery.    Pt stated reason for WLS: previously w/[employer], but switched jobs and now restarting program.  Tired of being overweight.     Weight Hx:   Onset of weight concerns: most of her life.  Previous attempts at weight loss: currently on AOM (since June 2024, 290 at the time). Has also tried [employer], calorie counting, exercise.  Most successful attempt at weight loss: 40lb w/exercise in 2021 working w/a trainer.  Highest adult weight: 290lb  Lowest adult weight: 185lb      Social History:     Lives alone. 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    Food Insecurity: No Food Insecurity (02/08/2023)    Hunger Vital Sign     Worried About Running Out of Food in the Last Year: Never true     Ran Out of Food in the Last Year: Never true   denies    Alcohol Use: Not At Risk (01/05/2023)    AUDIT-C     Frequency of Alcohol Consumption: Monthly or less     Average Number of Drinks: 1 or 2     Frequency of Binge Drinking: Never       Learns best by: hands-on    Lifestyle/Activity Level/Exercise:    On her feet a lot at work.  No exercise routine.    Diet Overview:    Food Recall:     Breakfast: bacon or sausage w/toast, plain low-fat greek yogurt w/berries, handful of protein granola, drizzle of honey.     Lunch: may get meal at cafe at work or bring meal (e.g., hamburger w/rice and gravy)     Dinner: lunch leftovers or ham sandwich and bowl of fruit.     Snacks: minimal     Beverages: trying to drink more water  and also having zero sugar gingerale.      Food prepared outside home: 5-6x/wk, definitely weekends (Friday lunch/dinner, Sat breakfast/dinner)      Other pertinent dietary comments: notes before GLP-1 was eating heavier meals/larger portions. Deals w/some nausea and  constipation. She has increased her fiber to help w/constipation. Denies special dietary needs. Has used Premier protein in the past, but they were upsetting her stomach.     Psychosocial History:  Pt is able to identify the following emotions/situations that trigger them to eat:  before medication, stress. Also menstrual cravings. Foods include: mostly sweets, especially chocolate.   Pt feels that they overeat/emotionally eat about less than once a week. Previously was daily.    Eating Disorder hx: denies.    O:  Ht:    Ht Readings from Last 1 Encounters:   04/07/23 5' 8     Wt:   265lb 12.8oz BMI:  There is no height or weight on file to calculate BMI.   IBW:  154lb Excess Wt:  111lb ABW:  200lb    Nutritionally pertinent labs: n/a    Vitamin/Mineral Deficiency Hx:  vit D  Pt is currently taking the following OTC supplements:  magnesium  oxide, riboflavin  (both per neuro for migraines).    Allergies:  Allergies[1]    Smoking: Tobacco Use History[2]    Comorbidities:  Problem List[3]    A:   Nutrition Diagnosis: obesity related to a history of excessive energy intake, limited physical  activity, self-monitoring/knowledge deficit as evidenced by report, dietary recall, and BMI.       Per dietary recall, pt to benefit from reducing reliance on outside foods. She will also start trying various protein supplements as she did not tolerate Premier well.  She is starting to implement dietary changes and is motivated to continue with this as she prepares for surgery.    We discussed nutritional expectations prior to bariatric surgery including insurance-required goals and reduced surgical risks associated with wt loss prior to surgery.  Pre-op and post-op dietary protocols , the need for lifelong vitamin/mineral supplementation, adequate protein intake, eating at regular intervals, and long-term commitment to physical activity was discussed.  Explained the value of implementing lifestyle changes prior to surgery to better  prepare for the long-term commitments ahead.    Pt comprehension level good, and anticipated compliance moderate.  Pt receives my recommendation as a good candidate for bariatric surgery.   Pt is currently cleared for bariatric surgery from a nutrition standpoint.    P:      Per pt insurance, pt is required to complete 3 months of pre-surgery weight loss classes, and classes will help to strengthen knowledge deficit and post-operative weight loss management.     Initial goals:    1.  Pt to continue with current weight loss program as required by insurance company.    2.  Pt to review nutrition plan post - operatively and contact RD with questions.  Contact information provided.      Pt verbalized understanding and did not voice any additional questions.  Spent a total of 45 minutes educating pt in an individual one-on-one setting.  Plan reviewed with bariatric team.         [1]   Allergies  Allergen Reactions    Doxycycline Hives, Itching and Nausea And Vomiting    Penicillins Hives and Itching    Doxycycline Hives    Penicillins     Unknown [Other Drug]      Unknown eye drop - made infection worse    [2]   Social History  Tobacco Use   Smoking Status Never   Smokeless Tobacco Never   [3]   Patient Active Problem List  Diagnosis    Severe obesity (BMI 35.0-39.9) with comorbidity    OSA (obstructive sleep apnea)    Anxiety    Depression

## 2023-04-14 ENCOUNTER — Encounter (INDEPENDENT_AMBULATORY_CARE_PROVIDER_SITE_OTHER): Payer: Self-pay

## 2023-04-14 ENCOUNTER — Ambulatory Visit (INDEPENDENT_AMBULATORY_CARE_PROVIDER_SITE_OTHER): Payer: No Typology Code available for payment source | Admitting: Registered"

## 2023-04-14 DIAGNOSIS — Z713 Dietary counseling and surveillance: Secondary | ICD-10-CM

## 2023-04-16 NOTE — Progress Notes (Signed)
 Arlyne DELENA Livings, LPC  ________________________    04/16/2023    Adrian Spina  [address]  [place], [place] [address]  [phone] Phone  [phone] Fax     DOB: [birth year]    MRN#: [id number]  Name: JULLIA, MULLIGAN    On April 17, 2023, I met via telemedicine with Candelaria Browning Jarold for a psychological evaluation for clearance for bariatric surgery.  A semi-structured diagnostic interview with Ms. Jarold reviewed her background information including her weight/dietary history and current behavioral patterns of eating. Ovie Eastep' candidacy for bariatric surgery was assessed based on the criteria and guidelines suggested by American Society for Metabolic and Bariatric Surgery.    In our meeting, Ms. Jarold' mental status presentations and her social/emotional functioning were assessed, as well as current and potential future life stressors. Surgery candidacy determination included a review of  Ms. Jarold' understanding of bariatric surgery and the psychological and behavioral requisites that support a successful outcome.  The meeting with Ms. Haver included an emphasis on her lifelong personal responsibilities to comply with health monitoring requirements, nutritional guidelines and a program of regular physical activity.  She was advised of areas of focus and potential challenges specific to her psychosocial history and eating behavioral profile.              Although an initial evaluation cannot comprehensively determine any patient's long term prognosis for bariatric success, Ms. Choma did not present with any palpable psychological counter-indications for bariatric surgery and she is clinically cleared for surgery at this time.  For details of assessment, please see report below.    Ms. Crites reported she is participating in [facility] pre-surgical nutritional program.  She reported examples of implementing behavioral change to support her goal  of a healthier life style.  The ramifications of personal change from both short term and long term perspectives were discussed.     Ms. Lewis has been advised of the immediate and long term psychological challenges of bariatric surgery in general as well as those specific to her profile. Oria Klimas has been advised of cognitive and behavioral symptoms that could compromise an optimal outcome to her course of treatment and was given bariatric resources for future reference.     Arlyne DELENA Livings, [facility]  Therapist IV  Integrated Behavioral Health  [facility]  ______________________________________________________________________   Outpatient Services Bariatric Individual Psychological Evaluation    04/16/2023    Start: 1:00 PM End: 1:33  PM    Visit Modality: Virtual, Method of Telehealth: Video     Interpreter present? no    Patient was unaccompanied.    Intake Behavioral Health Registration:  Provider will review the following documents verbally with patient prior to starting this evaluation.    Informed Consent for Behavioral Health Therapy:  Verbal Informed Consent has been obtained to participate in behavioral healthcare services (risk vs benefits, care coordination and confidentiality limits) and opportunity to address questions provided: yes    Intake Behavioral Health Registration forms/documentation have be reviewed.     Reason for appointment: Norleen Xie is a 38 y.o. African American single female presenting for a psychological evaluation for a clinical recommendation for bariatric related surgery.    Behavioral Observations:     Ms. Reedy was on time for the video visit.  Patient presented as alert, oriented in all spheres and engaged in the encounter.  Patient presents as psychologically stable and cognitively able to describe, understand and consent to  bariatric surgery.     Assessments were done to review patient's surgical readiness regarding patient's mental  status and levels of functioning, psychiatric history, psychosocial components of eating behavior, past and present physical activity status, family of origin dynamics, body image, trauma history, weight management history and life style adjustment plans.     Purpose of Assessment:  To determine within the limits of psychological certainly:  Whether the patient is prepared for the changes in diet and lifestyle which bariatric surgery will impose; whether the patient has a reliable support system and access to supportive resources;  Whether patient is committed to post operative compliance following bariatric surgery; and whether the patient has sufficient information on which to base informed consent for bariatric surgery.     Psychosocial History:     Patient is originally from: [place]  Children: 0.  Pets: none  Patient currently lives in/with: apartment with self   Quality of Social Relationships: Good  Work status: currently employed, FT, Child Psychotherapist  Education: master's degree  Religious/Spiritual affiliation:[employer]  Coping strategies: exercise, friends and family  Stressors: Work  Sleep: 9 hours  Leisure or Fun: Binge watch TV    Transportation Needs? Private Vehicle - Patient to drive self.    Financial resource strain: Patient denies  Food insecurity:Patient denies    H/O Psychiatric Symptoms:  Anxiety: dx 2020, worry, current symptoms mild-moderate  Eating Disorders: Denies associated symptoms.  History of disordered eating requiring treatment: Patient denies  Concerns about disordered eating behavior now: Patient denies  Depression: dx 2020, lack of motivation, mood swings,depressed mood, in grad school, in remission since 2022  Mania: Denies associated symptoms.  OCD: NA  Psychosis: Patient denies  Other: Patient denies    Family History[1]  Family History of Death by Suicide/ Homicide: Patient denies  Family History of Mental Illness: Patient denied   Family History of Substance Use: Significant:  Father:  EtOH  Family History of Obesity: Endorses    She denies a history of abuse and trauma.       Mental Health Treatment History and Outcomes:  Previous Treatment/diagnosis:  Out patient therapy; currently weekly;  past hx 2019-2022; 2016  Current Psychiatrist:PCP  Psychotropic medications: Topiramate  25 mg for migraines  Previous hospitalizations: Patient denies    Use of caffeine : rare soda, occ. coffee  Tobacco use: Patient denies  Substance Use:  Alcohol  uses currently, rare glass of wine 2x a month  Denies illicit use of recreational substances and prescription drugs    C.A.G.E  Patient feels she ought to cut down on drinking and/or drug use: no  Patient has been annoyed by others criticizing drinking or drug use: no  Patient has felt bad or guilty about her drinking or drug use:no  Patient has had a drink or used drugs as an eye opener first thing in the morning to steady nerves, get rid of a hangover or get the day started: no    Patient acknowledges information about potential transfer of addictive behavior patterns following weight loss surgery.    Legal History: (past or pending charges, convictions, probation or parole status)    Legal consequences of chemical use: no   Legal status: The patient has no significant history of legal issues.      Weight Hx:   Patient is preparing for the following bariatric surgery: SG  Patient accurately described the procedure and notes no current or past abnormal anxiety, fear, phobias in regards to surgical and medical procedures.  Obesity Related Psychosocial distress:  Anxious mood, body size dissatisfaction, weight related impairments in realms of daily functioning, weight related medical conditions, weight related health risks    Obesity Narrative:  Onset of weight concerns: life long  Lowest adult weight: 185 lbs (age 52)  Highest adult weight: 290 lbs  2024  Present Weight: 259 lbs BMI 40.41  Desired Weight: 200 lbs  Patient has made the following behavioral  changes to improve readiness for surgery: AOM, light working out    Who shops for food? patient  Who prepares food? patient  Who eats with patient? patient    Patient has tried multiple diet and exercise programs without achieving long term success. She reports feeling that she has exhausted her options for independent weight loss and needs the assistance of surgery to improve her health, quality and quantity of life.   Per patient's insurance, patient is required to complete pre-surgery education classes to help to strengthen knowledge deficit and support behaviors of post-operative weight loss management.        Current Exercise Habits walking    Patient has been evaluated and has been recommended by the surgical RD as a good candidate for bariatric surgery.      Bariatric Bucket list:  Trendy clothes, having children    Assessments and inventories:     Completed Screening Measures:       01/06/2023     3:00 PM 01/06/2023    12:01 AM   PHQ2/PHQ9 SCORE    PHQ2 Score 0    PHQ9 Score  0          No data to display              PHQ-9:  Little interest or pleasure in doing things: (Patient-Rptd) Not at all  Feeling down, depressed, or hopeless: (Patient-Rptd) Not at all  Trouble falling or staying asleep, or sleeping too much: (Patient-Rptd) Not at all  Feeling tired or having little energy: (Patient-Rptd) Several days  Poor appetite or overeating: (Patient-Rptd) Not at all  Feeling bad about yourself - or that you are a failure or have let yourself or your family down: (Patient-Rptd) Not at all  Trouble concentrating on things, such as reading the newspaper or watching television: (Patient-Rptd) Not at all  Moving or speaking so slowly that other people could have noticed. Or the opposite - being so fidgety or restless that you have been moving around a lot more than usual: (Patient-Rptd) Not at all  Thoughts that you would be better off dead, or of hurting yourself in some way: (Patient-Rptd) Not at all  PHQ Total  Score: (Patient-Rptd) 1    Patient presenting with minimal to no symptoms of depression     Gad-7 Patient-Entered Questionnaire    Question 04/17/2023 12:55 PM EST - Filed by Patient   Feeling nervous, anxious or on edge Several days   Not being able to stop or control worrying Not at all   Worrying too much about different things Not at all   Trouble relaxing Several days   Being so restless that it is hard to sit still Not at all   Feeling afraid as if something awful might happen Not at all   GAD-7 Total Score (range: 0 - 21) Incomplete     Mental Status Exam:   General Appearance: neatly groomed, casually dressed, and overweight   Behavior/Psychomotor: normal  and good eye contact  Mood: euthymic  Affect: congruent with mood  Speech:  normal pitch and normal volume  Language: verbal expression is clear , has comprehensible ideas through verbal articulation, and ideas are conveyed and properly produced  Thought Form/Process: well organized, associations intact, and goal directed  Thought Content: absent of psychotic symptoms and absent of suicidal or homicidal ideation  Perception /Sensorium: clear, intact and makes sense of stimuli received  Judgment: good  Insight: good  Cognitive Functioning: cognition grossly intact, alert, oriented to person, oriented to place, oriented to time, oriented to situation, memory intact, and attentive     Risk Assessment:   Risk factors: None reported or assessed  Protective Factors: Identifies reasons for living Supportive social network of family or friends and Engaged in work or school  Suicide Attempts/Gestures/Thoughts/Plan:-No suicidal thoughts, no intent, no plan  Homicidal Thoughts/Plan/Gestures:-No homicidal thoughts, no intent, no plan.  Recent Physical Aggression/Violence/Anger:Denies associated symptoms.  Access to Weapons: none    Diagnosis & Problem:      No diagnosis found.  Patient Active Problem List    Diagnosis Date Noted    Severe obesity (BMI 35.0-39.9) with  comorbidity 04/01/2023    OSA (obstructive sleep apnea) 04/01/2023    Anxiety     Depression        Clinical Summary & Plan:     Ms. Schlottman presents with obesity related psychosocial distress and weight related impairments in activities of daily living.    Patient's pre-operative challenges to focus on are in the following areas:  Increase exercise    Ms. Cagley presents with strengths and positive supports in the following areas:  Clinical assessment data suggests that the patient is a suitable bariatric surgical candidate at this time.    The patient indicates that she has made an informed decision to have bariatric surgery and that she is knowledgeable of the risks, benefits, alternatives and the potential complications of the procedure.    The patient states reasonable post-procedural expectations.    Patient acknowledges information about potential transfer of addictive behavior patterns following weight loss surgery.  She asserts that she has reliable resources that will support her in her weight loss journey.    Arrangements for post-operative care and assistance have been made.     Ms. Awe is psychologically CLEARED for surgery.    Referrals provided: Yes: Bariatric support group         [1]   Family History  Problem Relation Name Age of Onset    Hypertension Mother Carmisha Larusso     Arthritis Mother Jesilyn Easom     Obesity Mother Talah Cookston     Asthma Father Breanah Faddis     Hypertension Father Zelma Snead     Alcohol abuse Father Wealthy Danielski     Arthritis Father Ramonia Mcclaran     Diabetes Father Tyrica Afzal     Alcohol abuse Paternal Grandfather Claudean Jarold     Early death Brother Layman Jarold     Kidney failure Brother Layman Jarold     Obesity Sister Oddis Jarold     Obesity Sister [facility]

## 2023-04-17 ENCOUNTER — Other Ambulatory Visit (INDEPENDENT_AMBULATORY_CARE_PROVIDER_SITE_OTHER): Payer: No Typology Code available for payment source

## 2023-04-17 ENCOUNTER — Telehealth (HOSPITAL_BASED_OUTPATIENT_CLINIC_OR_DEPARTMENT_OTHER): Payer: No Typology Code available for payment source | Admitting: Licensed Professional Counselor

## 2023-04-17 ENCOUNTER — Other Ambulatory Visit (FREE_STANDING_LABORATORY_FACILITY): Payer: No Typology Code available for payment source

## 2023-04-17 DIAGNOSIS — E66813 Obesity, class 3: Secondary | ICD-10-CM

## 2023-04-17 DIAGNOSIS — Z01818 Encounter for other preprocedural examination: Secondary | ICD-10-CM

## 2023-04-17 DIAGNOSIS — G4733 Obstructive sleep apnea (adult) (pediatric): Secondary | ICD-10-CM

## 2023-04-17 DIAGNOSIS — Z6841 Body Mass Index (BMI) 40.0 and over, adult: Secondary | ICD-10-CM

## 2023-04-17 DIAGNOSIS — F411 Generalized anxiety disorder: Secondary | ICD-10-CM

## 2023-04-17 LAB — HEMOGLOBIN A1C
Average Estimated Glucose: 102.5 mg/dL
Hemoglobin A1C: 5.2 % (ref 4.6–5.6)

## 2023-04-21 ENCOUNTER — Encounter (INDEPENDENT_AMBULATORY_CARE_PROVIDER_SITE_OTHER): Payer: Self-pay | Admitting: Family Medicine

## 2023-04-21 ENCOUNTER — Telehealth (INDEPENDENT_AMBULATORY_CARE_PROVIDER_SITE_OTHER): Admitting: Family Medicine

## 2023-04-21 ENCOUNTER — Telehealth (INDEPENDENT_AMBULATORY_CARE_PROVIDER_SITE_OTHER): Payer: No Typology Code available for payment source | Admitting: Family Medicine

## 2023-04-21 VITALS — Wt 259.0 lb

## 2023-04-21 DIAGNOSIS — Z6835 Body mass index (BMI) 35.0-35.9, adult: Secondary | ICD-10-CM

## 2023-04-21 DIAGNOSIS — E66812 Obesity, class 2: Secondary | ICD-10-CM

## 2023-04-21 MED ORDER — SEMAGLUTIDE-WEIGHT MANAGEMENT 1.7 MG/0.75ML SC SOAJ
1.7000 mg | SUBCUTANEOUS | 0 refills | Status: DC
Start: 2023-04-21 — End: 2023-04-27

## 2023-04-21 NOTE — Progress Notes (Signed)
 [facility] [place]                  TELEMEDICINE (VIDEO) VISIT    Documentation Requirements    Provider and Title: Meade KIDD. Lawson, DO  Verbal consent has been obtained from the patient to conduct a VIDEO visit encounter: yes  Patient currently in [place] : yes  Language, if applicable and if translator was required: English         Date of Exam: 04/21/2023 1:30 PM        Patient ID: Tonya Camacho is a 38 y.o. female.  Attending Physician: Meade KIDD Lawson, DO        Chief Complaint:    Chief Complaint   Patient presents with    weight magament      Initial weight management  loss options              HPI:    Patient is presenting for weight management (initial / transfer of care)  Patient is currently on semaglutide  that was prescribed 2.21 mg of compounded Wegovy  with B12 through [employer] (online platform for weight loss) - max dose for them is 2.67 mg   Has been on it since June 2024.   Has lost about 40 lbs since starting on semaglutide   Exercise - walks twice weekly (30-45 mins) which she has been doing since she has been on semaglutide    Diet - High protein diet since June 2024, eat two meals / day on average, may eat a light breakfast          Problem List, Medications, and Allergies reviewed: Yes           Physical Exam:    Self-reported Vitals:   Visit Vitals  Wt 117.5 kg (259 lb)   LMP 04/18/2023   BMI 39.38 kg/m     Pulmonary/Chest: Effort normal. No respiratory distress.   Neurological: patient  is alert.   Psychiatric: patient has a normal mood and affect. Patient's behavior is normal. Judgment and thought content normal.            Assessment / Plan:    1. Class 2 severe obesity due to excess calories with serious comorbidity and body mass index (BMI) of 35.0 to 35.9 in adult  Patient transferring weigh loss management to her primary  Discussed dosages for wegovy  are not equivalent to her compounded semaglutide   May be safer to transition to wegovy  1.7 mg which is lower in  dosage  Recommend once weekly injection  Patient to continue to exercise with progressive increases in activity level  Continue on high protein diet with reduce calorie  Follow up in 4 weeks   - semaglutide  (Wegovy ) 1.7 MG/0.75ML injection; Inject 0.75 mLs (1.7 mg) into the skin once a week for 4 doses  Dispense: 3 mL; Refill: 0          Follow-up:    No follow-ups on file.         Meade KIDD Lawson, DO

## 2023-04-21 NOTE — Progress Notes (Signed)
 Have you seen any specialist/other providers since your last visit with [place]?  No    Are you located in the state of [place]?  Yes    Do you consent to this telemedicine visit?  Yes      Health Maintenance Due   Topic Date Due    COVID-19 Vaccine (4 - 2024-25 season) 10/19/2022

## 2023-04-22 ENCOUNTER — Telehealth (HOSPITAL_BASED_OUTPATIENT_CLINIC_OR_DEPARTMENT_OTHER): Payer: Self-pay

## 2023-04-22 NOTE — Progress Notes (Signed)
 Tonya Camacho is a 38 y.o. year old female  S (Subjective): Pt actively participated in group activity and was pleased with weight this visit.  Pt continues to be interested in [facility] to improve co-morbid conditions.     O (Objective):    Wt Readings from Last 10 Encounters:   04/21/23 117.5 kg (259 lb)   04/14/23 120.6 kg (265 lb 12.8 oz)   04/07/23 119.3 kg (263 lb)   04/01/23 117.5 kg (259 lb)   03/24/23 119.8 kg (264 lb 3.2 oz)   03/19/23 119.9 kg (264 lb 6.4 oz)   02/08/23 122.5 kg (270 lb)   01/06/23 125.6 kg (277 lb)   08/09/20 110.2 kg (243 lb)           A (Assessment): Patient with the condition of obesity (can add in BMI smart phrase). Patient continues to demonstrate motivation to lose weight and change behaviors.    -Class focused on exercise and physical activity, Target Heart Rate Zone, Perceived Exertion, Strength training, aerobic activity, and post op exercise.      P (Plan):   Return in 1 month for additional class if required.   Homework: Increase exercise time.   Pt will continue goal setting, class participation, and making small but measurable improvements in 1-2 months      Educated pt for 30 minutes in a group setting. Plan reviewed with surgeon.       Karleen Coe Physiologist,MPH

## 2023-04-23 ENCOUNTER — Encounter (INDEPENDENT_AMBULATORY_CARE_PROVIDER_SITE_OTHER): Payer: Self-pay | Admitting: Family Medicine

## 2023-04-27 ENCOUNTER — Ambulatory Visit (INDEPENDENT_AMBULATORY_CARE_PROVIDER_SITE_OTHER): Payer: No Typology Code available for payment source | Admitting: Family

## 2023-04-27 ENCOUNTER — Encounter (INDEPENDENT_AMBULATORY_CARE_PROVIDER_SITE_OTHER): Payer: Self-pay | Admitting: Family

## 2023-04-27 ENCOUNTER — Encounter (INDEPENDENT_AMBULATORY_CARE_PROVIDER_SITE_OTHER): Payer: Self-pay

## 2023-04-27 ENCOUNTER — Telehealth: Payer: Self-pay

## 2023-04-27 ENCOUNTER — Ambulatory Visit
Admission: RE | Admit: 2023-04-27 | Discharge: 2023-04-27 | Disposition: A | Discharge status: Home / Self Care | Source: Ambulatory Visit | Attending: Family | Admitting: Family

## 2023-04-27 DIAGNOSIS — Z7189 Other specified counseling: Secondary | ICD-10-CM

## 2023-04-27 DIAGNOSIS — Z1321 Encounter for screening for nutritional disorder: Secondary | ICD-10-CM

## 2023-04-27 NOTE — Progress Notes (Signed)
 Second Visit    Patient Name: Tonya Camacho, Tonya Camacho  Age: 38 y.o.  Sex: female   DOB: 1985/02/19  MRN: [id number]    Progress Note:    Tonya Camacho presents today  for a second visit.    She is planning to undergo Robotic Assisted Laparoscopic Sleeve Gastrectomy with Dr. Marge    Pre-op testing/requirements:  - All pre-operative testing was explained as well as the education requirement at [facility]).     - The patient was informed of the need to obtain ALL of the following PRIOR to meeting with surgeon pre-operatively:    - EGD: done 02/28/22  - CXR: needs  - EKG: needs   - Labs: needs   - Evaluation for sleep apnea: OSA doesn't use CPAP    - Smoking status: never    Clearances:  - Medical clearance: needs   - Psychological clearance: done 04/17/23  - Nutrition clearance: done 04/14/23   -Additional clearance: none      Special Instructions:   All pre-operative requirements and clearances were explained:  Medications, medical allergies, and health history were reviewed. All instructions were provided verbally or in writing.  She was instructed to avoid all anti-inflammatory medications for two weeks prior to surgery and indefinitiely thereafter.    DOS TBD   She was instructed not to take any vitamins for one week prior to surgery.  She was instructed to avoid all immunosuppressive medications and estrogen-related hormones (excluding IUD) for 1 month pre/post operatively.  She was instructed to bring the CPAP machine to the hospital the day of surgery, if applicable.   No smoking at LEAST 8 weeks prior to surgery and remain smoke-free indefinitely thereafter. This applies to cigarettes, vape, marijuana, e-cigarette etc. Discussed risks a/w smoking both pre- and post- operatively. Will plan for nicotine testing prior to surgery if applicable. Pt verbalized understanding and agrees with the plan.  For women of childbearing age: She was advised to use a reliable birth control method and  AVOID PREGNANCY for 18 months after surgery.     Program completion attestation: Patient agrees and attests that he/she has attempted weight loss in the past without successful long-term weight reduction and has participated in the intensive multicomponent behavioral interventions designed to help achieve or maintain weight loss through a combination of dietary changes and increased physical activity. The intensive multicomponent behavioral intervention program had components focusing on nutrition, physical activity and behavioral modification and was supervised by behavioral therapists, psychologists, registered dietitians, exercise physiologists and other staff.    Pre-op nutrition: Pt aware of pre-op diet. Received sample pack of liquid diet. Pt will be on liquid diet for 2 weeks prior to surgery. Once pt is approved for surgery and has been scheduled, pt will purchase the pre-op diet from [facility] unless otherwise discussed with the Registered Dietitian. Pt verbalized understanding and desired compliance. All questions addressed to satisfaction.     Medications, medical allergies, and health history were reviewed.   All questions were answered to satisfaction    Past Medical History:     Past Medical History:   Diagnosis Date    Anxiety     Depression     Environmental allergies     Gastroesophageal reflux disease     no longer has GERD per recent EGD    Obesity     Pneumonia     Seasonal allergic rhinitis     Sleep apnea     does not use  cpap    STD (sexually transmitted disease)     Genital Herpes       Past Surgical History:   Past Surgical History[1]    Family History:   Family History[2]    Allergies:   Allergies[3]    Medications:     Prior to Admission medications    Medication Sig Start Date End Date Taking? Authorizing Provider   ACYCLOVIR PO Take by mouth    [provider]   albuterol  sulfate HFA (PROVENTIL ) 108 (90 Base) MCG/ACT inhaler Inhale 2 puffs into the lungs every 4 (four)  hours as needed for Wheezing (cough) Dispense with spacer 03/24/23 04/23/23  Herrington, Elio PARAS, MD   ibuprofen  (ADVIL ) 600 MG tablet Take 1 tablet (600 mg) by mouth every 6 (six) hours as needed for Pain or Fever 03/24/23   Maylene Elio PARAS, MD   Magnesium  400 MG Tab Take 1 tablet (400 mg) by mouth daily 04/07/23   Darrick Dubois, MD   ondansetron  (ZOFRAN -ODT) 4 MG disintegrating tablet Take 1 tablet (4 mg) by mouth every 6 (six) hours as needed for Nausea 03/24/23   Maylene Elio PARAS, MD   Riboflavin  400 MG Cap Take 1 capsule (400 mg) by mouth daily 04/07/23   Darrick Dubois, MD   rizatriptan  (MAXALT ) 10 MG tablet Take 1 tablet (10 mg) by mouth as needed for Migraine May repeat one additional dose in 2 hours if needed 04/07/23   Darrick Dubois, MD   semaglutide  (Wegovy ) 1.7 MG/0.75ML injection Inject 0.75 mLs (1.7 mg) into the skin once a week for 4 doses 04/21/23 05/13/23  Lawson Meade KIDD, DO   topiramate  (TOPAMAX ) 25 MG tablet Take 2 tablets (50 mg) by mouth 2 (two) times daily 04/07/23 07/06/23  Darrick Dubois, MD   valACYclovir  HCL (VALTREX ) 500 MG tablet Take 1 tablet (500 mg) by mouth 2 (two) times daily    [provider]       Review of Systems:     Constitutional: negative for fevers, night sweats  Head and neck: denies blurry vision, dry mouth or hearing impairment  Respiratory: negative for SOB, cough  Cardiovascular: negative for chest pain, palpitations  Gastrointestinal: negative for abdominal pain or bloating  Genitourinary: negative for hematuria, dysuria  Musculoskeletal: negative for bone pain, myalgias and stiff joints  Neurological: negative for dizziness, gait problems, headaches and memory problems  Behavioral/Psych: negative for fatigue, loss of interest in favorite activities, sleep disturbance  Endocrine: negative for temperature intolerance    Physical Exam:     Vitals:    04/27/23 0700   BP: 121/85   Pulse: 81     Wt Readings from Last 10 Encounters:   04/27/23 261 lb   04/21/23 259 lb    04/14/23 265 lb 12.8 oz   04/07/23 263 lb   04/01/23 259 lb   03/24/23 264 lb 3.2 oz   03/19/23 264 lb 6.4 oz   02/08/23 270 lb   01/06/23 277 lb   08/09/20 243 lb       Appearance: Comfortable, no acute distress, well nourished  HEENT: Normocephalic, clear conjunctiva   Chest: Breathing unlabored  Neuro: A&Ox3  Psych: Calm, cooperative      Assessment:   Euphemia Lingerfelt is a 38 y.o. female with morbid obesity who presents for pre-operative visit for Robotic Assisted Laparoscopic Sleeve Gastrectomy.  All of her questions were answered.     Plan:   - Complete all pre-operative requirements prior to upcoming  visit with surgeon.    - Liquid diet prior to surgery.   - Return to the office for nutritional counseling and education as well as for a final pre-operative visit with Dr. Marge .       Signed by: Fortunato JINNY Moody, FNP,                  [1]   Past Surgical History:  Procedure Laterality Date    COSMETIC SURGERY      Lipoma removal    EXCISION CYST/LIPOMA      TONSILLECTOMY     [2]   Family History  Problem Relation Name Age of Onset    Hypertension Mother Ladonne Sharples     Arthritis Mother Cadyn Rodger     Obesity Mother Leshay Desaulniers     Asthma Father Lazariah Savard     Hypertension Father Dorissa Stinnette     Alcohol abuse Father Nikiyah Fackler     Arthritis Father Raysha Tilmon     Diabetes Father Eustolia Drennen     Alcohol abuse Paternal Grandfather Claudean Slocumb     Early death Brother Layman Slocumb     Kidney failure Brother Layman Slocumb     Obesity Sister Oddis Slocumb     Obesity Sister Virgia Glad    [3]   Allergies  Allergen Reactions    Doxycycline Hives, Itching and Nausea And Vomiting    Penicillins Hives and Itching    Doxycycline Hives    Penicillins     Unknown [Other Drug]      Unknown eye drop - made infection worse

## 2023-04-27 NOTE — Progress Notes (Unsigned)
 Called patient in regards to MyChart message report of vision problems and eye pain. Patient reports that she started taking topiramate  a little over two weeks ago and has since noticed eye pain and blurry vision. Patient advised to hold medication until she can discuss with neurologist. Patient advised to discuss today  with Dr. Darrick, neurologist and prescriber of topiramate . Patient agreeable and call transferred to Dr. Suzie office.

## 2023-04-27 NOTE — Telephone Encounter (Signed)
 Patient contacted the call center reporting since taking Topiramate  25mg  she has been experiencing pain in both eyes. Patient would like to know if she should discontinue medication. Patient can be reached at [phone]. Can someone please support.    Thanks in advance.

## 2023-04-28 ENCOUNTER — Encounter (INDEPENDENT_AMBULATORY_CARE_PROVIDER_SITE_OTHER): Payer: Self-pay

## 2023-04-29 ENCOUNTER — Other Ambulatory Visit (FREE_STANDING_LABORATORY_FACILITY)

## 2023-04-29 ENCOUNTER — Encounter (INDEPENDENT_AMBULATORY_CARE_PROVIDER_SITE_OTHER): Payer: Self-pay | Admitting: Family Medicine

## 2023-04-29 ENCOUNTER — Telehealth (INDEPENDENT_AMBULATORY_CARE_PROVIDER_SITE_OTHER): Payer: Self-pay

## 2023-04-29 ENCOUNTER — Ambulatory Visit (INDEPENDENT_AMBULATORY_CARE_PROVIDER_SITE_OTHER): Admitting: Family Medicine

## 2023-04-29 VITALS — BP 131/91 | HR 84 | Temp 98.1°F | Resp 18 | Wt 262.4 lb

## 2023-04-29 DIAGNOSIS — E66812 Obesity, class 2: Secondary | ICD-10-CM

## 2023-04-29 DIAGNOSIS — Z01818 Encounter for other preprocedural examination: Secondary | ICD-10-CM

## 2023-04-29 DIAGNOSIS — Z7189 Other specified counseling: Secondary | ICD-10-CM

## 2023-04-29 DIAGNOSIS — Z1321 Encounter for screening for nutritional disorder: Secondary | ICD-10-CM

## 2023-04-29 DIAGNOSIS — Z6839 Body mass index (BMI) 39.0-39.9, adult: Secondary | ICD-10-CM

## 2023-04-29 LAB — FOLATE
Folate: 14.3 ng/mL (ref >5.4)
Hemolysis Index: 16 {index}

## 2023-04-29 LAB — CBC
Absolute nRBC: 0 10*3/uL (ref <=0.00)
Hematocrit: 42.5 % (ref 34.7–43.7)
Hemoglobin: 13.6 g/dL (ref 11.4–14.8)
MCH: 27.7 pg (ref 25.1–33.5)
MCHC: 32 g/dL (ref 31.5–35.8)
MCV: 86.6 fL (ref 78.0–96.0)
MPV: 10.4 fL (ref 8.9–12.5)
Platelet Count: 290 10*3/uL (ref 142–346)
RBC: 4.91 10*6/uL (ref 3.90–5.10)
RDW: 15 % (ref 11–15)
WBC: 8.73 10*3/uL (ref 3.10–9.50)
nRBC %: 0 /100{WBCs} (ref <=0.0)

## 2023-04-29 LAB — COMPREHENSIVE METABOLIC PANEL
ALT: 11 U/L (ref <=55)
AST (SGOT): 16 U/L (ref <=41)
Albumin/Globulin Ratio: 1.1 (ref 0.9–2.2)
Albumin: 4 g/dL (ref 3.5–5.0)
Alkaline Phosphatase: 66 U/L (ref 37–117)
Anion Gap: 10 (ref 5.0–15.0)
BUN: 9 mg/dL (ref 7–21)
Bilirubin, Total: 0.3 mg/dL (ref 0.2–1.2)
CO2: 23 meq/L (ref 17–29)
Calcium: 9.5 mg/dL (ref 8.5–10.5)
Chloride: 106 meq/L (ref 99–111)
Creatinine: 0.8 mg/dL (ref 0.4–1.0)
GFR: >60 mL/min/{1.73_m2} (ref >=60.0)
Globulin: 3.5 g/dL (ref 2.0–3.6)
Glucose: 63 mg/dL — ABNORMAL LOW (ref 70–100)
Hemolysis Index: 5 {index}
Potassium: 4.3 meq/L (ref 3.5–5.3)
Protein, Total: 7.5 g/dL (ref 6.0–8.3)
Sodium: 139 meq/L (ref 135–145)

## 2023-04-29 LAB — LIPID PANEL
Cholesterol / HDL Ratio: 3.2 {index}
Cholesterol: 203 mg/dL — ABNORMAL HIGH (ref <=199)
HDL: 63 mg/dL (ref >=40)
LDL Calculated: 126 mg/dL — ABNORMAL HIGH (ref 0–99)
Triglycerides: 71 mg/dL (ref 34–149)
VLDL Calculated: 14 mg/dL (ref 10–40)

## 2023-04-29 LAB — PT/INR
INR: 1 (ref 0.9–1.1)
PT: 11.7 s (ref 10.1–12.9)

## 2023-04-29 LAB — ECG 12-LEAD
Atrial Rate: 79 {beats}/min
IHS MUSE NARRATIVE AND IMPRESSION: NORMAL
P Axis: 21 degrees
P-R Interval: 144 ms
Q-T Interval: 392 ms
QRS Duration: 92 ms
QTC Calculation (Bezet): 449 ms
R Axis: 40 degrees
T Axis: 31 degrees
Ventricular Rate: 79 {beats}/min

## 2023-04-29 LAB — IRON PROFILE
Iron Saturation: 27 % (ref 15–50)
Iron: 71 ug/dL (ref 32–157)
TIBC: 263 ug/dL — ABNORMAL LOW (ref 265–497)
UIBC: 192 ug/dL (ref 126–382)

## 2023-04-29 LAB — THYROID STIMULATING HORMONE (TSH) WITH REFLEX TO FREE T4: TSH: 2.97 u[IU]/mL (ref 0.35–4.94)

## 2023-04-29 LAB — VITAMIN B12: Vitamin B-12: 761 pg/mL (ref 211–911)

## 2023-04-29 LAB — VITAMIN D, 25 OH, TOTAL: Vitamin D 25-OH, Total: 16 ng/mL — ABNORMAL LOW (ref 30–100)

## 2023-04-29 NOTE — Addendum Note (Signed)
 Addended by: Carlos American on: 04/29/2023 09:55 AM     Modules accepted: Orders

## 2023-04-29 NOTE — Progress Notes (Signed)
 Have you seen any specialists/other providers since your last visit with [place]?    No    Health Maintenance Due   Topic Date Due    COVID-19 Vaccine (4 - 2024-25 season) 10/19/2022

## 2023-04-29 NOTE — Progress Notes (Signed)
 Primary Care - [facility]     Subjective:      Patient is here for pre-operative examination/visit.    Date of Surgery: TBD  Surgery: Laparoscopic sleeve gastrectomy   Surgeon: Dr. Marge  Fax Number (Required): [phone]    Active Ambulatory Problems     Diagnosis Date Noted    Severe obesity (BMI 35.0-39.9) with comorbidity (CMS/HCC) 04/01/2023    OSA (obstructive sleep apnea) 04/01/2023    Anxiety     Depression      Resolved Ambulatory Problems     Diagnosis Date Noted    No Resolved Ambulatory Problems     Past Medical History:   Diagnosis Date    Environmental allergies     Gastroesophageal reflux disease     Obesity     Pneumonia     Seasonal allergic rhinitis     Sleep apnea     STD (sexually transmitted disease)      Current Medications[1]     Previous problem with anesthesia? yes  History of DVT / PE? No   Smoking history? no  Use of NSAIDs? no  Use of ASA? no  Use of anticoagulants? no     Need for emergency noncardiac surgery? no     ACC/AHA Surgical procedure risk:   []  LOW (risk of cardiac death or nonfatal MI generally < 1%)  -- ambulatory surgery, endoscopic procedures, superficial procedures, cataract surgery, breast surgery   [x]  MODERATE (risk of cardiac death or nonfatal MI generally 1-5%)   -- carotid endarterectomy, head and neck surgery, intraperitoneal/intrathoracic surgery, orthopedic surgery, prostate surgery   []  HIGH (risk of cardiac death or nonfatal MI often > 5%) -- aortic and other major vascular surgery, peripheral artery surgery, major emergency, prolonged surgery with large fluid shifts    Revised Cardiac Risk Index:   []  High risk type surgery (see above)   []  Coronary Artery Disease   -history of ischemic heart disease: h/o MI or positive exercise test, current complaint of chest pain considered d/t MI, use of nitrate therapy, OR EKG with pathologic Q waves  -DO NOT count prior revascularization procedure unless one of the other criteria for ischemic heart disease is  present    []  History of Heart Failure   []  History of cerebrovascular disease   []  Diabetes Mellitus requiring insulin   []  Preoperative serum creatinine > 2 mg/dL (822 umol/L)     Interpretation of score: rate of cardiac death, nonfatal MI, and nonfatal cardiac arrest according to number of predictors:   [x]  No risk factors = 0.4%    []  1 risk factor = 0.9%    []  2 risk factors = 6.6%    []  >/= 3 risk factors = 11%     Functional Capacity (METS): consider highest level (>4 METS implies low cardiovascular risk from surgery)   []  1.75 = walk around the house   []  2.70 = dishes, dust, light housework   []  2.75 = eat, dress, bathe, toilet   []  2.75 = walk 1 or 2 blocks on level ground   [x]  3.50 = vacuum, carry groceries, sweep   []  4.50 = push power mower, rake leaves   [x]  5.25 = have sex   []  5.50 = flight of stairs   []  6.00 = golf, bowl, dance   []  7.50 = swim, ski, strenuous sport   []  8.00 = scrub floors, move furniture   []  8.00 = run short distance  Objective:     Visit Vitals  BP (!) 131/91 (BP Site: Left arm, Patient Position: Sitting, Cuff Size: Large)   Pulse 84   Temp 98.1 F (36.7 C)   Resp 18   Wt 119 kg (262 lb 6.4 oz)   LMP 04/14/2023   SpO2 97%   BMI 39.90 kg/m     Review of Systems   Constitutional:  Negative for chills, fever and malaise/fatigue.   HENT:  Negative for congestion, ear discharge, ear pain and sore throat.    Eyes: Negative.    Respiratory:  Negative for cough, sputum production, shortness of breath and wheezing.    Cardiovascular:  Negative for chest pain, palpitations and leg swelling.   Gastrointestinal:  Negative for abdominal pain, constipation, diarrhea, nausea and vomiting.   Genitourinary:  Negative for dysuria, flank pain, frequency, hematuria and urgency.   Musculoskeletal: Negative.    Skin:  Negative for rash.   Neurological:  Negative for dizziness, focal weakness and headaches.   Psychiatric/Behavioral: Negative.         Physical Exam  Vitals reviewed.    Constitutional:       General: She is not in acute distress.     Appearance: Normal appearance.   HENT:      Right Ear: Tympanic membrane, ear canal and external ear normal.      Left Ear: Tympanic membrane, ear canal and external ear normal.      Nose: No congestion or rhinorrhea.      Mouth/Throat:      Mouth: Mucous membranes are moist.      Pharynx: Oropharynx is clear. No oropharyngeal exudate or posterior oropharyngeal erythema.   Eyes:      Extraocular Movements: Extraocular movements intact.      Conjunctiva/sclera: Conjunctivae normal.      Pupils: Pupils are equal, round, and reactive to light.   Cardiovascular:      Rate and Rhythm: Normal rate and regular rhythm.      Pulses: Normal pulses.      Heart sounds: Normal heart sounds.   Pulmonary:      Effort: Pulmonary effort is normal.      Breath sounds: Normal breath sounds.   Abdominal:      Palpations: Abdomen is soft.      Tenderness: There is no abdominal tenderness. There is no guarding or rebound.   Musculoskeletal:      Cervical back: Neck supple. No tenderness.   Lymphadenopathy:      Cervical: No cervical adenopathy.   Skin:     Findings: No rash.   Neurological:      General: No focal deficit present.      Mental Status: She is alert and oriented to person, place, and time.      Cranial Nerves: No cranial nerve deficit.   Psychiatric:         Mood and Affect: Mood normal.         Behavior: Behavior normal.       EKG Results       Procedure Component Value Units Date/Time    ECG 12 lead [[id number]] Collected: 04/29/23 0914     Updated: 04/29/23 0914     Ventricular Rate 79 BPM      Atrial Rate 79 BPM      P-R Interval 144 ms      QRS Duration 92 ms      Q-T Interval 392 ms      QTC Calculation (Bezet)  449 ms      P Axis 21 degrees      R Axis 40 degrees      T Axis 31 degrees      IHS MUSE NARRATIVE AND IMPRESSION --     NORMAL SINUS RHYTHM  NORMAL ECG      Narrative:      NORMAL SINUS RHYTHM  NORMAL ECG          Assessment and Plan:      Assessment & Plan   Purity was seen today  for pre-op exam.    Diagnoses and all orders for this visit:    Preop examination  Class 2 severe obesity due to excess calories with serious comorbidity and body mass index (BMI) of 39.0 to 39.9 in adult  Patient will be scheduled for laparoscopic sleeve gastrectomy with Dr. Marge  Presently patient is clinically stable and considered low risk   Pre-operative labs ordered by surgeon. EKG completed to and is NSR  If lab results are normal, patient is considered medically optimized to proceed with surgery  -     ECG 12 lead    Follow-up:   No follow-ups on file.     Meade MALVA Drop, DO                    [1]   Current Outpatient Medications   Medication Sig Dispense Refill    ibuprofen  (ADVIL ) 600 MG tablet Take 1 tablet (600 mg) by mouth every 6 (six) hours as needed for Pain or Fever 20 tablet 0    Magnesium  400 MG Tab Take 1 tablet (400 mg) by mouth daily 90 tablet 3    ondansetron  (ZOFRAN -ODT) 4 MG disintegrating tablet Take 1 tablet (4 mg) by mouth every 6 (six) hours as needed for Nausea 8 tablet 0    Riboflavin  400 MG Cap Take 1 capsule (400 mg) by mouth daily 90 capsule 3    rizatriptan  (MAXALT ) 10 MG tablet Take 1 tablet (10 mg) by mouth as needed for Migraine May repeat one additional dose in 2 hours if needed 12 tablet 5    topiramate  (TOPAMAX ) 25 MG tablet Take 2 tablets (50 mg) by mouth 2 (two) times daily 120 tablet 3    valACYclovir  HCL (VALTREX ) 500 MG tablet Take 1 tablet (500 mg) by mouth 2 (two) times daily      albuterol  sulfate HFA (PROVENTIL ) 108 (90 Base) MCG/ACT inhaler Inhale 2 puffs into the lungs every 4 (four) hours as needed for Wheezing (cough) Dispense with spacer 18 g 0     No current facility-administered medications for this visit.

## 2023-04-29 NOTE — Telephone Encounter (Signed)
 Pre-Authorization Started:    Micah Flesher on availity and initiated pre-certification for 69629 sleeve with Dr. Shelby Mattocks DOS (Dummy Date of Surgery): 06/02/23  Reference # [id number] OP  Clinicals sent through website

## 2023-04-30 ENCOUNTER — Encounter (INDEPENDENT_AMBULATORY_CARE_PROVIDER_SITE_OTHER): Payer: Self-pay | Admitting: Family Medicine

## 2023-05-01 ENCOUNTER — Other Ambulatory Visit

## 2023-05-01 LAB — VITAMIN A: Vitamin A: 39 ug/dL (ref 38–98)

## 2023-05-01 MED ORDER — VITAMIN D (ERGOCALCIFEROL) 1.25 MG (50000 UT) PO CAPS
50000.0000 [IU] | ORAL_CAPSULE | ORAL | 0 refills | Status: DC
Start: 2023-05-01 — End: 2023-10-21

## 2023-05-05 ENCOUNTER — Telehealth (INDEPENDENT_AMBULATORY_CARE_PROVIDER_SITE_OTHER): Admitting: Family Medicine

## 2023-05-05 LAB — WHOLE BLOOD VITAMIN B1 (THIAMINE): Vitamin B1, Whole Blood: 87 nmol/L (ref 78–185)

## 2023-05-07 ENCOUNTER — Telehealth (INDEPENDENT_AMBULATORY_CARE_PROVIDER_SITE_OTHER): Payer: No Typology Code available for payment source

## 2023-05-09 ENCOUNTER — Encounter (INDEPENDENT_AMBULATORY_CARE_PROVIDER_SITE_OTHER): Payer: Self-pay | Admitting: Family Medicine

## 2023-05-11 ENCOUNTER — Ambulatory Visit (INDEPENDENT_AMBULATORY_CARE_PROVIDER_SITE_OTHER): Payer: No Typology Code available for payment source | Admitting: Family

## 2023-05-11 MED ORDER — VALACYCLOVIR HCL 500 MG PO TABS
500.0000 mg | ORAL_TABLET | Freq: Two times a day (BID) | ORAL | 3 refills | Status: AC
Start: 2023-05-11 — End: 2024-05-10

## 2023-05-11 NOTE — Telephone Encounter (Addendum)
 Final Determination:    Went on availity to check status of case.   Patient has been approved for  56224 lap sleeve gastrectomy with Dr. Marge.   IND:Jluy is valid from 05/11/23-11/11/23  Ref# [id number] Outpatient   Routed message to Sarah/Diana at [facility] office to contact the patient and schedule surgery and appointments.

## 2023-05-12 ENCOUNTER — Encounter (HOSPITAL_BASED_OUTPATIENT_CLINIC_OR_DEPARTMENT_OTHER): Payer: Self-pay

## 2023-05-13 ENCOUNTER — Emergency Department

## 2023-05-13 ENCOUNTER — Telehealth (HOSPITAL_BASED_OUTPATIENT_CLINIC_OR_DEPARTMENT_OTHER): Payer: Self-pay

## 2023-05-13 ENCOUNTER — Emergency Department
Admission: EM | Admit: 2023-05-13 | Discharge: 2023-05-13 | Disposition: A | Discharge status: Home / Self Care | Attending: Emergency Medicine | Admitting: Emergency Medicine

## 2023-05-13 ENCOUNTER — Encounter (INDEPENDENT_AMBULATORY_CARE_PROVIDER_SITE_OTHER): Payer: Self-pay | Admitting: Family Medicine

## 2023-05-13 DIAGNOSIS — R202 Paresthesia of skin: Secondary | ICD-10-CM | POA: Insufficient documentation

## 2023-05-13 DIAGNOSIS — M79661 Pain in right lower leg: Secondary | ICD-10-CM | POA: Insufficient documentation

## 2023-05-13 DIAGNOSIS — M79604 Pain in right leg: Secondary | ICD-10-CM

## 2023-05-13 NOTE — ED Triage Notes (Signed)
 Tonya Camacho is a 38 y.o. female presents with swelling and tenderness, and warm to the touch and burning in the R lower leg. Pt states this has been ongoing for the past month. Pt denies any hx of blood clot and is not on oral contraceptive. Pt denies any shortness of breath.

## 2023-05-13 NOTE — Telephone Encounter (Signed)
 Spoke to patient scheduled pre-op, post op and surgery for 5/27 Dr. Georgana Kilts  Discussed remaining test  Medical clearance done  EKG done  Labs done  2 wk diet

## 2023-05-13 NOTE — ED Provider Notes (Signed)
 [facility] EMERGENCY DEPARTMENT  ATTENDING PHYSICIAN HISTORY AND PHYSICAL EXAM     Patient Name: Tonya Camacho, Tonya Camacho  Department:[facility] EMERGENCY DEPT  Encounter Date:  05/13/2023  Attending Physician: Dahlia Colburn, MD  Age: 38 y.o. female  Patient Room: RE4/RE4  PCP: Lawson Meade KIDD, DO        Diagnosis/Disposition:     Final diagnoses:   Right leg pain   Paresthesias     ED Disposition       ED Disposition   Discharge    Condition   --    Date/Time   Wed May 13, 2023  8:21 PM    Comment   Lynita Groseclose discharge to home/self care.    Condition at disposition: Stable                  Medical Decision Making     Assessment & Plan  Right leg pain with tingling   right leg pain with tingling in the 5th and right lateral foot, likely due to nerve compression, possibly from the sciatic nerve or lumbar region. No ttp lateral proximal tibia. No mass. FROM of knee.  - Refer to [facility] spine clinic for further evaluation and possible MRI.  - Advise against sleeping on the right side or using compression stockings.  - Recommend over-the-counter NSAIDs for pain management if no contraindications.  - Instruct to return if symptoms worsen.    Back pain  Intermittent lower back pain, possibly due to nerve compression in the lumbar region contributing to leg symptoms. No red flags for spinal cord compression (no urinary or bowel incontinence or retension), no fever (to suggest spinal infection)  - Refer to [facility] spine clinic for further evaluation and possible MRI.  - Recommend over-the-counter NSAIDs for pain management if no contraindications.       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 The pain is described as a daily, constant sensation, 'like I'm wearing  like a weight on my leg.' The discomfort is located in the right calf and radiates down to the pinky toe. The patient also reports some back pain and tingling in the feet, particularly in the pinky toe. The symptoms are present all the time, but are especially noticeable at night and upon waking in the morning. The patient recently started wearing compression socks on the recommendation of her sister, a nurse, but has not noticed any improvement. The patient also reports a history of a swollen knee in 2020, which was evaluated by a rheumatologist and physical therapist, but no definitive diagnosis was made.           Physical Exam   Triage Vitals: Pulse 90  BP (!) 129/91  Resp 16  SpO2 100 %  Temp 98.2 F (36.8 C)  Physical Exam  GENERAL: Well appearing, . Comfortable, NAD.  HEENT: Atraumatic. No conjunctival injection. No icteric sclera.  NECK: Supple, normal range of motion.  RESPIRATORY: No respiratory distress.   CARDIOVASCULAR: perfused extremities  LOWER EXTREMITY: No edema. No calf tenderness. FROM of knee. FROM. No calf tenderness. Compartment syndrome soft.  Warm and well perfused. DP and PT pulses intact. Extremities without blood  clot, arterial exam normal. +2 reflex knee and patella b/l  NEUROLOGICAL: Awake alert and oriented times three. Speech is smooth. No facial asymmetry. CN intact. Gait steady. Sensation intact throughout. Motor strength intact throughout five out of five. Sensation intact. SKIN: Warm. No rash.  PSYCHIATRIC: Normal affect. Normal insight.  MUSCULOSKELETAL: Knee not swollen. No midline spine tenderness.     Interpretations, Clinical Decision Tools and Critical Care:     O2 Sat:  The patient's oxygen  saturation was 100 % on room air. This was independently interpreted by me as Normal.                Procedures   Procedures    Attestations   I, Elane Sierras, MD, am the primary attending physician of record for this patient.   My documentation is often completed after the  patient is no longer under my clinical care. In some cases, the Epic EMR may pull updated results into the above documentation which may not reflect all results or information that were available to me at the time of my medical decision making.        Elane Sierras, MD  05/13/23 2119

## 2023-05-13 NOTE — ED Triage Notes (Addendum)
 [facility] EMERGENCY DEPARTMENT  Provider in Triage Note        Patient Name: Lanayah Gartley    Chief Complaint:   Chief Complaint   Patient presents with    Leg Swelling    Tingling       HPI: Kennady Zimmerle is a 38 y.o. female, who has had a rapid medical screening evaluation initiated by myself. Reports RLE tingling and calf pain. No trauma. Wants to r/o DVT today . Only takes meds for migraines. No other pertinent medical hx.     Denies chest pain, sob   No long travel, immobil, surgeries, hospitalizations   No hx of VTE     Medical/Surgical/Social history: as per HPI    Vitals: Ht 5' 8 (1.727 m)   Wt 117 kg   LMP 04/14/2023   BMI 39.23 kg/m     Pertinent brief exam:   Examination of area of concern: nad     Preliminary orders: US      Symptom based preliminary diagnosis/MDM: RLE pain     Patient advised to remain in the ED until further evaluation can be performed. Patient instructed to notify staff of any changes in condition while waiting.  This assessment is an initial evaluation to expedite care.

## 2023-05-13 NOTE — Discharge Instructions (Addendum)
 Dear Ms. Gittins:    Thank you for choosing the [facility] Emergency Department and it was a pleasure to be part of your care team. I hope your visit today  was EXCELLENT.    Specific instructions for your visit today :    VISIT SUMMARY:  You came in today  because of pain in your right calf that started in early March. You described the pain as a constant sensation, like wearing a weight on your leg, and it radiates down to your pinky toe. You also mentioned some back pain and tingling in your feet, especially noticeable at night and in the morning. You have been wearing compression socks but haven't noticed any improvement.    YOUR PLAN:  -RIGHT LEG PAIN WITH TINGLING: Your chronic right leg pain with tingling in the pinky toe is likely due to nerve compression, possibly from the sciatic nerve or lumbar region. We will refer you to the [place] spine clinic for further evaluation and possibly an MRI. Please avoid sleeping on your right side and using compression stockings. You can take over-the-counter NSAIDs for pain management if you have no contraindications. Return if your symptoms worsen.    -BACK PAIN: Your intermittent lower back pain may be due to nerve compression in the lumbar region, which could be contributing to your leg symptoms. We will refer you to the [place] spine clinic for further evaluation and possibly an MRI. You can take over-the-counter NSAIDs for pain management if you have no contraindications.    INSTRUCTIONS:  Please follow up with the [place] spine clinic for further evaluation and possible MRI. Avoid sleeping on your right side and using compression stockings. Take over-the-counter NSAIDs for pain management if you have no contraindications. Return if your symptoms worsen.         At the time of your discharge, you have been evaluated and determined not to be suffering from any life-threatening illnesses. However, it is possible that you have visited the emergency department in the early part of an  illness and that your status may change, requiring further evaluation and possibly return to the emergency department.      We have discussed with you all your results and findings in the ER. We have provided you with all your results. Please review all the results with your primary care doctor as it may contain important information that may or may not be relevant to your visit today  and may need additional follow up. We have discussed the risks and benefits to the medications that were prescribed today  however please speak with your doctor for further information regarding the prescriptions you have received today .     If you do not continue to improve or your condition worsens, please contact your doctor or return immediately to the Emergency Department.    Sincerely,  Elane Sierras, MD  Attending Emergency Physician  [facility] Emergency Department      OBTAINING A PRIMARY CARE APPOINTMENT    Primary care physicians (PCPs, also known as primary care doctors) are either internists or family medicine doctors. Both types of PCPs focus on health promotion, disease prevention, patient education and counseling, and treatment of acute and chronic medical conditions.    Call for an appointment with a primary care doctor.  Ask to see who is taking new patients.     [facility]  telephone:  [phone]  [email or web address]    DOCTOR REFERRALS  Call [phone] (available 24 hours a day, 7 days a week)  if you need any further referrals and we can help you find a primary care doctor or specialist.  Also, available online at:  [email or web address]    YOUR CONTACT INFORMATION  Before leaving please check with registration to make sure we have an up-to-date contact number.  You can call registration at [phone] to update your information.  For questions about your hospital bill, please call [phone].  For questions about your Emergency Dept Physician bill please call  [phone].      FREE HEALTH SERVICES  If you need help with health or social services, please call 2-1-1 for a free referral to resources in your area.  2-1-1 is a free service connecting people with information on health insurance, free clinics, pregnancy, mental health, dental care, food assistance, housing, and substance abuse counseling.  Also, available online at:  http://www.[facility].org    MEDICAL RECORDS AND TESTS  Certain laboratory test results do not come back the same day, for example urine cultures.   We will contact you if other important findings are noted.  Radiology films are often reviewed again to ensure accuracy.  If there is any discrepancy, we will notify you.      Please call [phone] to pick up a complimentary CD of any radiology studies performed.  If you or your doctor would like to request a copy of your medical records, please call [phone].      ORTHOPEDIC INJURY   Please know that significant injuries can exist even when an initial x-ray is read as normal or negative.  This can occur because some fractures (broken bones) are not initially visible on x-rays.  For this reason, close outpatient follow-up with your primary care doctor or bone specialist (orthopedist) is required.    MEDICATIONS AND FOLLOWUP  Please be aware that some prescription medications can cause drowsiness.  Use caution when driving or operating machinery.    The examination and treatment you have received in our Emergency Department is provided on an emergency basis, and is not intended to be a substitute for your primary care physician.  It is important that your doctor checks you again and that you report any new or remaining problems at that time.        ASSISTANCE WITH INSURANCE    Affordable Care Act  [facility])  Call to start or finish an application, compare plans, enroll or ask a question.  [phone]  TTY: [phone]  Web:  Healthcare.gov    Help Enrolling in [facility]  Cover  [place]   754-354-0940 (TOLL-FREE)  [phone] (TTY)  Web:  Http://www.coverva.org    Local Help Enrolling in the [employer]CA  [place]  Family Service  [phone] (MAIN)  Email:  health-help@nvfs .org  Web:  Blackjackmyths.is  Address:  [address] [place], [place] [address]    SEDATING MEDICATIONS  Sedating medications include strong pain medications (e.g. narcotics), muscle relaxers, benzodiazepines (used for anxiety and as muscle relaxers), Benadryl /diphenhydramine  and other antihistamines for allergic reactions/itching, and other medications.  If you are unsure if you have received a sedating medication, please ask your physician or nurse.  If you received a sedating medication: DO NOT drive a car. DO NOT operate machinery. DO NOT perform jobs where you need to be alert.  DO NOT drink alcoholic beverages while taking this medicine.     If you get dizzy, sit or lie down at the first signs. Be careful going up and down stairs.  Be extra careful to prevent falls.  Never give this medicine to others.     Keep this medicine out of reach of children.     Do not take or save old medicines. Throw them away when outdated.     Keep all medicines in a cool, dry place. DO NOT keep them in your bathroom medicine cabinet or in a cabinet above the stove.    MEDICATION REFILLS  Please be aware that we cannot refill any prescriptions through the ER. If you need further treatment from what is provided at your ER visit, please follow up with your primary care doctor or your pain management specialist.

## 2023-05-18 ENCOUNTER — Encounter (INDEPENDENT_AMBULATORY_CARE_PROVIDER_SITE_OTHER): Payer: Self-pay

## 2023-05-18 ENCOUNTER — Ambulatory Visit (INDEPENDENT_AMBULATORY_CARE_PROVIDER_SITE_OTHER)

## 2023-05-18 VITALS — BP 121/86 | HR 88 | Temp 97.6°F | Resp 16 | Ht 68.0 in | Wt 258.0 lb

## 2023-05-18 DIAGNOSIS — N764 Abscess of vulva: Secondary | ICD-10-CM

## 2023-05-18 MED ORDER — MUPIROCIN 2 % EX OINT
TOPICAL_OINTMENT | Freq: Two times a day (BID) | CUTANEOUS | 0 refills | Status: AC
Start: 2023-05-18 — End: ?

## 2023-05-18 MED ORDER — SULFAMETHOXAZOLE-TRIMETHOPRIM 800-160 MG PO TABS
1.0000 | ORAL_TABLET | Freq: Two times a day (BID) | ORAL | 0 refills | Status: AC
Start: 2023-05-18 — End: 2023-05-25

## 2023-05-18 NOTE — Progress Notes (Signed)
 Patient: Tonya Camacho   Date: 05/18/2023   MRN: [id number]           Subjective     Chief Complaint   Patient presents with    Recurrent Skin Infections     Patient here with c/o recurrent boil near groin x 3 weeks.        HPI     Akaila is a 38 y.o. pt that presents with a recurrent boil in her vaginal labial area which she recently had I&D'ed at another urgent care facility. She admits shaving the area which might be a contributing factor.      Location: *vaginal labia   Onset: recurrent   Duration: 3 weeks   Associated symptoms: none   Recent treatment: *warm compresses        History:  Pertinent Past Medical, Surgical, Family and Social History were reviewed.  Current Medications[1]  Allergies[2]  Medications and Allergies reviewed.         Objective   Vitals:    05/18/23 1852   BP: 121/86   BP Site: Right arm   Patient Position: Sitting   Cuff Size: Large   Pulse: 88   Resp: 16   Temp: 97.6 F (36.4 C)   TempSrc: Tympanic   SpO2: 98%   Weight: 117 kg (258 lb)   Height: 1.727 m (5' 8)     Body mass index is 39.23 kg/m.    Physical Exam  Vitals and nursing note reviewed. Exam conducted with a chaperone present (Cherise (MA)).   Constitutional:       General: She is not in acute distress.     Appearance: She is not ill-appearing or toxic-appearing.   Pulmonary:      Effort: Pulmonary effort is normal. No respiratory distress.   Genitourinary:         Comments: *area of hyperemia and mild swelling on the R labia majora;  no fluctuance, induration, or local cellulitis  Skin:     General: Skin is warm and dry.   Neurological:      General: No focal deficit present.      Mental Status: She is alert and oriented to person, place, and time.              UCC Course   There were no labs reviewed with this patient during the visit.  There were no x-rays reviewed with this patient during the visit.  Current Inpatient Medications with Last Dose Taken[3]       Procedures    Procedures      MDM/Assessment      Differential Diagnosis including but not limited to: Eczema, Atopic Dermatitis, Contact Dermatitis, Viral Exanthem, Fungal Infection, Cellulitis, Insect Bite, Allergic Drug Reaction, Stasis Dermatitis, Shingles, Erythema Migrans, Pityriasis Rosea.  Encounter Diagnosis   Name Primary?    Furuncle of vulva Yes            Plan  Apply warm compresses to the area intermittently and Bactroban  ointment twice daily as prescribed.  Start taking Bactrim  DS antibiotic as prescribed.  Take ibuprofen /Tylenol  for pain relief as needed.  See your primary care provider for follow-up.  Return to clinic if needed.          Expected course of rash resolution discussed with patient/family.  Medications as prescribed.    No orders of the defined types were placed in this encounter.    Requested Prescriptions     Signed Prescriptions Disp Refills  mupirocin  (BACTROBAN ) 2 % ointment 15 g 0     Sig: Apply topically 2 (two) times daily    sulfamethoxazole -trimethoprim  (BACTRIM  DS) 800-160 MG per tablet 14 tablet 0     Sig: Take 1 tablet by mouth 2 (two) times daily for 7 days       Discussed results and diagnosis with patient/family.  Reviewed warning signs for worsening condition, as well as, indications for follow-up with primary care physician and return to urgent care clinic.   Patient/family expressed understanding of instructions.     An After Visit Summary with pertinent information was made available to patient/family via MyChart or in-print.         [1]   Current Outpatient Medications:     albuterol  sulfate HFA (PROVENTIL ) 108 (90 Base) MCG/ACT inhaler, Inhale 2 puffs into the lungs every 4 (four) hours as needed for Wheezing (cough) Dispense with spacer, Disp: 18 g, Rfl: 0    ibuprofen  (ADVIL ) 600 MG tablet, Take 1 tablet (600 mg) by mouth every 6 (six) hours as needed for Pain or Fever, Disp: 20 tablet, Rfl: 0    Magnesium  400 MG Tab, Take 1 tablet (400 mg) by mouth daily, Disp: 90 tablet,  Rfl: 3    ondansetron  (ZOFRAN -ODT) 4 MG disintegrating tablet, Take 1 tablet (4 mg) by mouth every 6 (six) hours as needed for Nausea, Disp: 8 tablet, Rfl: 0    Riboflavin  400 MG Cap, Take 1 capsule (400 mg) by mouth daily, Disp: 90 capsule, Rfl: 3    rizatriptan  (MAXALT ) 10 MG tablet, Take 1 tablet (10 mg) by mouth as needed for Migraine May repeat one additional dose in 2 hours if needed, Disp: 12 tablet, Rfl: 5    topiramate  (TOPAMAX ) 25 MG tablet, Take 2 tablets (50 mg) by mouth 2 (two) times daily, Disp: 120 tablet, Rfl: 3    valACYclovir  HCL (VALTREX ) 500 MG tablet, Take 1 tablet (500 mg) by mouth 2 (two) times daily, Disp: 180 tablet, Rfl: 3    vitamin D , ergocalciferol , (DRISDOL ) 50000 UNIT Cap, Take 1 capsule (50,000 Units) by mouth once a week, Disp: 12 capsule, Rfl: 0    mupirocin  (BACTROBAN ) 2 % ointment, Apply topically 2 (two) times daily, Disp: 15 g, Rfl: 0    sulfamethoxazole -trimethoprim  (BACTRIM  DS) 800-160 MG per tablet, Take 1 tablet by mouth 2 (two) times daily for 7 days, Disp: 14 tablet, Rfl: 0  [2]   Allergies  Allergen Reactions    Doxycycline Hives, Itching and Nausea And Vomiting    Penicillins Hives and Itching    Doxycycline Hives    Penicillins     Unknown [Other Drug]      Unknown eye drop - made infection worse    [3]   No current facility-administered medications for this visit.

## 2023-05-18 NOTE — Patient Instructions (Signed)
 Apply warm compresses to the area intermittently and Bactroban  ointment twice daily as prescribed.    Start taking Bactrim  DS antibiotic as prescribed.    Take ibuprofen /Tylenol  for pain relief as needed.    See your primary care provider for follow-up.    Return to clinic if needed.

## 2023-05-19 ENCOUNTER — Encounter (INDEPENDENT_AMBULATORY_CARE_PROVIDER_SITE_OTHER): Admitting: Family Medicine

## 2023-05-19 ENCOUNTER — Encounter (INDEPENDENT_AMBULATORY_CARE_PROVIDER_SITE_OTHER): Payer: Self-pay | Admitting: Family Medicine

## 2023-05-26 ENCOUNTER — Telehealth (HOSPITAL_BASED_OUTPATIENT_CLINIC_OR_DEPARTMENT_OTHER): Payer: Self-pay

## 2023-05-26 NOTE — Telephone Encounter (Signed)
 Called patient. LM pre-op appointment has been moved to 4/30. Surgeon will be in OR on 5/7

## 2023-06-08 ENCOUNTER — Encounter (INDEPENDENT_AMBULATORY_CARE_PROVIDER_SITE_OTHER): Payer: Self-pay

## 2023-06-16 ENCOUNTER — Encounter (INDEPENDENT_AMBULATORY_CARE_PROVIDER_SITE_OTHER): Payer: Self-pay | Admitting: Family Medicine

## 2023-06-17 ENCOUNTER — Ambulatory Visit (INDEPENDENT_AMBULATORY_CARE_PROVIDER_SITE_OTHER): Admitting: Surgery

## 2023-06-17 ENCOUNTER — Encounter (INDEPENDENT_AMBULATORY_CARE_PROVIDER_SITE_OTHER): Payer: Self-pay | Admitting: Surgery

## 2023-06-17 ENCOUNTER — Encounter (INDEPENDENT_AMBULATORY_CARE_PROVIDER_SITE_OTHER): Payer: Self-pay

## 2023-06-17 VITALS — BP 130/87 | HR 88 | Ht 68.0 in | Wt 264.0 lb

## 2023-06-17 DIAGNOSIS — E7800 Pure hypercholesterolemia, unspecified: Secondary | ICD-10-CM | POA: Insufficient documentation

## 2023-06-17 DIAGNOSIS — F419 Anxiety disorder, unspecified: Secondary | ICD-10-CM

## 2023-06-17 DIAGNOSIS — F32A Depression, unspecified: Secondary | ICD-10-CM

## 2023-06-17 DIAGNOSIS — E559 Vitamin D deficiency, unspecified: Secondary | ICD-10-CM | POA: Insufficient documentation

## 2023-06-17 DIAGNOSIS — G4733 Obstructive sleep apnea (adult) (pediatric): Secondary | ICD-10-CM

## 2023-06-17 NOTE — Telephone Encounter (Signed)
 Please have patient schedule an appointment to discuss the current status of her migraines. Thanks.

## 2023-06-17 NOTE — Progress Notes (Signed)
 Drs. Asencion Lawless, Renford Cartwright, and [facility]  [address]  [address]  [place], [place] [address]  [phone]   [phone]    [address]   [place], [place] [address]   [phone]   [phone]    The patient is a pleasant 38 y.o. year old morbidly obese female  who has failed multiple previous attempts at conservative weight loss. She has opted to proceed with robotic/laparoscopic SG-Sleeve and possible hiatal hernia repair as a surgical weight loss option.  All other options were discussed in detail. The patient has undergone an extensive preoperative education and medical clearance along with dietary and psychiatric counseling.      Bariatric comorbidities present: BMI: 40, OSA, hyperlipidemia    EGD- reportedly normal, no mention HH  Pathology- normal gastric antrum  Labs- Chol 203, LDL 126, D 16  CXR- wnl  Medicine- cleared    Patient Active Problem List    Diagnosis Date Noted    Pure hypercholesterolemia 06/17/2023    Vitamin D  deficiency 06/17/2023    Morbid obesity with BMI of 40.0-44.9, adult (CMS/HCC) 04/01/2023    OSA (obstructive sleep apnea) 04/01/2023    Anxiety     Depression      Medical History[1]   Past Surgical History[2]   Medications Taking[3]  Allergies[4]   Social History     Tobacco Use    Smoking status: Never    Smokeless tobacco: Never   Substance Use Topics    Alcohol use: Yes     Alcohol/week: 1.0 standard drink of alcohol     Types: 1 Glasses of wine per week     Comment: 2/month      Family History[5]     Review of Systems  Constitutional: negative for fevers, night sweats  Respiratory: negative for SOB, cough  Cardiovascular: negative for chest pain, palpitations  Gastrointestinal: negative for abd pain  Genitourinary:negative for hematuria, dysuria  Musculoskeletal:negative for bone pain, myalgias and stiff joints  Neurological: negative for dizziness, gait problems, headaches and memory problems  Behavioral/Psych: negative for fatigue or  anxiety  Endocrine: negative for temperature intolerance  Hematologic:  Negative for DVT/PE    Objective:  Vital signs in last 24 hours:    BP 130/87   Pulse 88   Ht 5\' 8"    Wt 264 lb   LMP 05/17/2023 (Exact Date)   BMI 40.14 kg/m   W/ female chaperone present during entire examination:  General Appearance:    Alert, cooperative, no distress, obese   Head:    Normocephalic, without obvious abnormality, atraumatic   Eyes:     EOM's intact, no scleral icterus,   Lungs:     Clear to auscultation bilaterally, respirations unlabored   Heart:    Regular rate and rhythm   Abdomen:     Soft, non-tender, bowel sounds active all four quadrants,     no masses, no organomegaly   Neurologic:   Normal strength       throughout     Assessment:  Morbid obesity, BMI 40.14  OSA  Anxiety  Depression  Migraines  Pure hyperlipidemia  Vitamin D  deficiency    Plan:  This is a 38year old morbidly obese female who has failed multiple previous attempts at conservative weight loss and has opted to proceed with robotic/laparoscopic SG-Sleeve and possible hiatal hernia repair.  All questions and concerns were addressed.  The risks, benefits and alternate options were discussed with in detail.  The  risks include but are not limited to bleeding, infection, leakage, sepsis, shock, wound infection, wound herniation, DVT, PE, death, as well as unforeseen conditions such as  inadequate weight loss, excessive weight loss, weight regain, stricture, ulcer, etc.      We have discussed the liquid diet prior to surgery as they are already consuming the protein shakes. This is needed to increase their protein stores and allow for reductions of the visceral fat (intra-abdominal and hepatic sources).  In addition, post surgery they will need several medications.  1) VTE prophylaxis, Lovenox which is weight dependant.    2) PPI therapy to reduce post-operative reflux and ulceration for about 3 months  3) Anti-nausea medication as needed  4) Pain  management--intra-op pt will under long acting TAPP block.  In addition, they will be prescribed a pain regimen with combination of narcotic and non-narcotic to reduce the need for narcotics.    Patient wishes to proceed with above mentioned plan and surgery at this time.      Winona Haw, MD  Bariatric Surgery  Cell [phone]  Office [phone]         [1]   Past Medical History:  Diagnosis Date    Anxiety     Depression     Environmental allergies     Gastroesophageal reflux disease     no longer has GERD per recent EGD    Headache     Migraines    Obesity     Pneumonia     Seasonal allergic rhinitis     Sleep apnea     does not use cpap    STD (sexually transmitted disease)     Genital Herpes   [2]   Past Surgical History:  Procedure Laterality Date    COSMETIC SURGERY      Lipoma removal    EXCISION CYST/LIPOMA      TONSILLECTOMY     [3]   Outpatient Medications Marked as Taking for the 06/17/23 encounter (Clinical Support) with Temple Feeler, MD   Medication Sig Dispense Refill    albuterol  sulfate HFA (PROVENTIL ) 108 (90 Base) MCG/ACT inhaler Inhale 2 puffs into the lungs every 4 (four) hours as needed for Wheezing (cough) Dispense with spacer 18 g 0    ibuprofen  (ADVIL ) 600 MG tablet Take 1 tablet (600 mg) by mouth every 6 (six) hours as needed for Pain or Fever 20 tablet 0    Magnesium  400 MG Tab Take 1 tablet (400 mg) by mouth daily 90 tablet 3    mupirocin  (BACTROBAN ) 2 % ointment Apply topically 2 (two) times daily 15 g 0    ondansetron  (ZOFRAN -ODT) 4 MG disintegrating tablet Take 1 tablet (4 mg) by mouth every 6 (six) hours as needed for Nausea 8 tablet 0    rizatriptan  (MAXALT ) 10 MG tablet Take 1 tablet (10 mg) by mouth as needed for Migraine May repeat one additional dose in 2 hours if needed 12 tablet 5    topiramate  (TOPAMAX ) 25 MG tablet Take 2 tablets (50 mg) by mouth 2 (two) times daily 120 tablet 3    valACYclovir  HCL (VALTREX ) 500 MG tablet Take 1 tablet (500 mg) by mouth 2  (two) times daily 180 tablet 3    vitamin D , ergocalciferol , (DRISDOL ) 50000 UNIT Cap Take 1 capsule (50,000 Units) by mouth once a week 12 capsule 0   [4]   Allergies  Allergen Reactions    Doxycycline Hives, Itching and Nausea And Vomiting  Penicillins Hives and Itching    Doxycycline Hives    Penicillins     Unknown [Other Drug]      Unknown eye drop - "made infection worse"    [5]   Family History  Problem Relation Name Age of Onset    Hypertension Mother Daielle Poole     Arthritis Mother Shyloh Ehmann     Obesity Mother Rayneisha Escareno     Asthma Father Tomas Koder     Hypertension Father Ania Krause     Alcohol abuse Father Lovelyn Zilles     Arthritis Father Azalya Pree     Diabetes Father Aanshi Horseman     Alcohol abuse Paternal Grandfather Albertina Alpers     Early death Brother Tsosie Gail     Kidney failure Brother Tsosie Gail     Diabetes Brother Tsosie Gail     Obesity Sister Adell Hones     Obesity Sister [facility]     Dementia Maternal Grandmother Larita Pluck     Migraines Paternal Grandmother Sula End

## 2023-06-18 ENCOUNTER — Telehealth (INDEPENDENT_AMBULATORY_CARE_PROVIDER_SITE_OTHER): Admitting: Registered"

## 2023-06-18 ENCOUNTER — Encounter (INDEPENDENT_AMBULATORY_CARE_PROVIDER_SITE_OTHER): Payer: Self-pay | Admitting: Registered"

## 2023-06-18 DIAGNOSIS — Z6841 Body Mass Index (BMI) 40.0 and over, adult: Secondary | ICD-10-CM

## 2023-06-18 DIAGNOSIS — Z719 Counseling, unspecified: Secondary | ICD-10-CM

## 2023-06-18 DIAGNOSIS — Z713 Dietary counseling and surveillance: Secondary | ICD-10-CM

## 2023-06-18 NOTE — Telephone Encounter (Signed)
 MyChart message sent reminding pt of pre-op diet start date.

## 2023-06-21 NOTE — Group Note (Signed)
 This class was conducted via [employer]    S:  Pt presents with questions about post op diet phases, supplements and lifestyle after weight loss surgery.  Pt presents for 2 hour pre operative education class.    A:  Pt has nutrition knowledge deficit regarding pre op and post op nutrition guidelines for weight loss surgery.  Pt has been educated on the following topics:  Anatomy review.  Necessary behavior/eating modifications to avoid complications.  Post op liquid diet phase  Post op mushy/soft foods diet phase  Vitamin/mineral supplementation and consequences of non compliance.  Reviewed symptoms of deficiencies.  Protein supplementation - requirements of protein supplements, amounts needed per surgery/pt, and consequences of non compliance.  Review of the nutrition fact panel, what to look for.  Review of dumping syndrome and it's effects in addition to trigger foods.  Review of possible post operative complications, tips/techniques to manage them.  Review of appropriate foods and meal plans for each diet phase.  Quick review of physical activity and the guidelines pt needs to follow post operatively.    P:  1.  Pt to follow up with MD and RD for final pre op visit.    2.  Contact information provided.  Pt to contact PRN.    Spent a total of 90 minutes educating pt in a group setting.  Plan reviewed with surgeon.

## 2023-06-23 ENCOUNTER — Telehealth (INDEPENDENT_AMBULATORY_CARE_PROVIDER_SITE_OTHER): Admitting: Family Medicine

## 2023-06-23 ENCOUNTER — Encounter (INDEPENDENT_AMBULATORY_CARE_PROVIDER_SITE_OTHER): Payer: Self-pay | Admitting: Family Medicine

## 2023-06-23 DIAGNOSIS — G43E09 Chronic migraine with aura, not intractable, without status migrainosus: Secondary | ICD-10-CM

## 2023-06-23 NOTE — Progress Notes (Unsigned)
 Have you seen any specialist/other providers since your last visit with [place]?  No    Are you located in the state of [place]?  Yes    Do you consent to this telemedicine visit?  Yes      Health Maintenance Due   Topic Date Due    COVID-19 Vaccine (4 - 2024-25 season) 10/19/2022

## 2023-06-23 NOTE — Progress Notes (Unsigned)
 [facility] [place]                  TELEMEDICINE (VIDEO) VISIT    Documentation Requirements    Provider and Title: Thera Flakes. Skippy Dun, DO  The Patient has given verbal consent for delivery of health care via telehealth.   The patient is located at Home in [place]   The encounter provider is located at their Medical Office in [place]   Epic Video Client was utilized for Real Time/Synchronous Telehealth.   The time spent in medical discussion during this visit was 21 minutes.  {Audio Only Visit (Optional):[id number]}         Date of Exam: 06/23/2023 8:15 AM        Patient ID: Tonya Camacho is a 38 y.o. female.  Attending Physician: Carlye Child, DO        Chief Complaint:    Chief Complaint   Patient presents with    forms     Accomadation forms. Accomodation regarding office setting lighting. Has migraines              HPI:    Patient is presenting for work accommodations for migraines. Patient reports history of severe migraines. Often takes time off work due to her migraines and advised by her migraines to get accommodations. Patient states she follows with neurology. States she was started on magnesium  (does not take riboflavin ) which helps. She self-discontinued  her Topiramate  - felt eye discomfort while taking it -- felt that they were strained more than usually. Patient takes rizatriptan  as needed when migraines occur which helps. Her migraines occur about 3-4 times per month. 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          Problem List, Medications, and Allergies reviewed: Yes             Physical Exam:    Self-reported Vitals:   Visit Vitals  LMP 06/10/2023       Pulmonary/Chest: Effort normal. No respiratory distress.   Neurological: patient  is alert.   Psychiatric: patient has a normal mood and affect. Patient's behavior is normal. Judgment and thought content normal.            Assessment / Plan:    There are no diagnoses linked to this encounter.          Follow-up:    No follow-ups  on file.         Carlye Child, DO

## 2023-06-24 ENCOUNTER — Encounter (INDEPENDENT_AMBULATORY_CARE_PROVIDER_SITE_OTHER): Admitting: Surgery

## 2023-06-25 ENCOUNTER — Telehealth (INDEPENDENT_AMBULATORY_CARE_PROVIDER_SITE_OTHER): Payer: Self-pay | Admitting: Family Medicine

## 2023-06-25 ENCOUNTER — Encounter (INDEPENDENT_AMBULATORY_CARE_PROVIDER_SITE_OTHER): Payer: Self-pay | Admitting: Family Medicine

## 2023-06-25 NOTE — Telephone Encounter (Signed)
 Copied from CRM [id number]. Topic: Non-Appointment Question - Document Request  >> Jun 25, 2023  3:09 PM Tonya Camacho wrote:  Tonya Camacho [facility] called about Non-Appointment Question - Document Request.  Additional details:  Pt is calling to check the status of her reasonable accomodation paperwork.

## 2023-06-26 ENCOUNTER — Encounter (INDEPENDENT_AMBULATORY_CARE_PROVIDER_SITE_OTHER): Payer: Self-pay

## 2023-06-30 ENCOUNTER — Encounter (INDEPENDENT_AMBULATORY_CARE_PROVIDER_SITE_OTHER): Payer: Self-pay

## 2023-06-30 ENCOUNTER — Ambulatory Visit (INDEPENDENT_AMBULATORY_CARE_PROVIDER_SITE_OTHER)

## 2023-06-30 NOTE — PSS Phone Screening (Signed)
 Documentation Already on Chart:  [x]  Medical Preop Note 3/12 encounters  [x]  Labs,3/12 lipids and iron profile abnl  [x]  EKG 3/12 ecg wnl  [x]  Psych Consult 2/28 encounters  [x]  CXR 3/10 imaging wnl  [x]  EGD 02/28/22 wnl    Pre-Anesthesia Evaluation    Pre-op phone visit requested by: Marge Charmaine CROME, MD  Reason for pre-op phone visit: Patient anticipating ROBOT XI ASSISTED, LAPAROSCOPIC, SLEEVE GASTRECTOMY procedure.         No orders of the defined types were placed in this encounter.      History of Present Illness/Summary:        Problem List:  Medical Problems       Hospital Problem List  Date Reviewed: 06/23/2023   None        Non-Hospital Problem List  Date Reviewed: 06/23/2023          ICD-10-CM Priority Class Noted Diagnosed    Morbid obesity with BMI of 40.0-44.9, adult (CMS/HCC) E66.01, Z68.41   04/01/2023     OSA (obstructive sleep apnea) G47.33   04/01/2023     Anxiety F41.9   Unknown     Depression F32.A   Unknown     Pure hypercholesterolemia E78.00   06/17/2023     Vitamin D  deficiency E55.9   06/17/2023         Medical History   Diagnosis Date    Anxiety     hx of-no med currently    Depression     hx of-no med currently    Environmental allergies     Gastroesophageal reflux disease     no longer has GERD per recent EGD    Headache     Migraines    Hyperlipidemia     no med    Influenza A 03/2023    Morbid obesity with BMI of 40.0-44.9, adult (CMS/HCC)     Pneumonia     as a teenager    Seasonal allergic rhinitis     Sleep apnea     does not use cpap    STD (sexually transmitted disease)     Genital Herpes     Past Surgical History[1]     Medication List            Accurate as of Jun 30, 2023  5:17 PM. Always use your most recent med list.                albuterol  sulfate HFA 108 (90 Base) MCG/ACT inhaler  Inhale 2 puffs into the lungs every 4 (four) hours as needed for Wheezing  Commonly known as: PROVENTIL   Medication Adjustments for Surgery: Take as needed     Magnesium  400 MG Tabs  Take 1 tablet  (400 mg) by mouth daily  Medication Adjustments for Surgery: Stop 7 days before surgery     ondansetron  4 MG disintegrating tablet  Take 1 tablet (4 mg) by mouth every 6 (six) hours as needed for Nausea  Commonly known as: ZOFRAN -ODT  Medication Adjustments for Surgery: Take as needed     rizatriptan  10 MG tablet  Take 1 tablet (10 mg) by mouth as needed for Migraine May repeat one additional dose in 2 hours if needed  Commonly known as: MAXALT   Medication Adjustments for Surgery: Take as needed     valACYclovir  HCL 500 MG tablet  Take 1 tablet (500 mg) by mouth 2 (two) times daily  Commonly known as: VALTREX   Medication Adjustments for Surgery: Take morning  of surgery     vitamin D  (ergocalciferol ) 50000 UNIT Caps  Take 1 capsule (50,000 Units) by mouth once a week  Commonly known as: DRISDOL   Medication Adjustments for Surgery: Stop 7 days before surgery            Allergies[2]  Family History[3]  Social History     Occupational History    Not on file   Tobacco Use    Smoking status: Never    Smokeless tobacco: Never   Vaping Use    Vaping status: Never Used   Substance and Sexual Activity    Alcohol use: Yes     Alcohol/week: 1.0 standard drink of alcohol     Types: 1 Glasses of wine per week     Comment: 2-3/month    Drug use: Never    Sexual activity: Yes     Partners: Male     Birth control/protection: None       Menstrual History:   LMP / Status  Having periods     Patient's last menstrual period was 06/10/2023 (approximate).    Tubal Ligation?  No valid surgical or medical questions entered.           Exam Scores:   SDB score  OSA Risk Category: At Risk   Previous Sleep Study Results: Mild OSA    STBUR score       PONV score  Nausea Risk: VERY SEVERE RISK    MST score  MST Score: 0    PEN-FAST score  PEN-FAST Score: 2    Frailty score       CHADsVasc            Visit Vitals  Ht 1.727 m (5' 8)   Wt 119.7 kg (264 lb)   LMP 06/10/2023 (Approximate)   BMI 40.14 kg/m   Obesity class 3 based on BMI.    Recent  Labs   CBC (last 180 days) 02/08/23  0931 04/29/23  0959   WBC 8.43 8.73   RBC 5.05 4.91   Hemoglobin 13.9 13.6   Hematocrit 43.1 42.5   MCV 85.3 86.6   MCH 27.5 27.7   MCHC 32.3 32.0   RDW 15 15   Platelet Count 281 290   MPV 10.4 10.4   nRBC % 0.0 0.0   Absolute nRBC 0.00 0.00     Recent Labs   BMP (last 180 days) 02/08/23  0931 04/29/23  0959   Glucose 84 63*   BUN 10 9   Creatinine 0.9 0.8   Sodium 138 139   Potassium 4.4 4.3   Chloride 107 106   CO2 22 23   Calcium 9.4 9.5   Anion Gap 9.0 10.0   GFR >60.0 >60.0     Recent Labs   Coag Panel (last 180 days) 02/08/23  0931 04/29/23  0959   PT 11.9 11.7   INR 1.1 1.0     Recent Labs   Other (last 180 days) 03/24/23  1502 04/17/23  0906 04/29/23  0959   TSH  --   --  2.97   Bilirubin, Total  --   --  0.3   ALT  --   --  11   AST (SGOT)  --   --  16   Protein, Total  --   --  7.5   Hemoglobin A1C  --  5.2  --    Iron  --   --  71   Iron Saturation  --   --  27   Vitamin B-12  --   --  761   SARS-CoV-2 (COVID-19) RNA Not Detected  --   --                       [1]   Past Surgical History:  Procedure Laterality Date    EXCISION CYST/LIPOMA      2011 and 2013    TONSILLECTOMY  1998   [2]   Allergies  Allergen Reactions    Doxycycline Hives, Itching and Nausea And Vomiting    Penicillins Hives and Itching    Unknown [Other Drug]      Unknown eye drop - made infection worse    [3]   Family History  Problem Relation Name Age of Onset    Hypertension Mother Jayelyn Barno     Arthritis Mother Saray Capasso     Obesity Mother Brandee Markin     Asthma Father Mollyann Halbert     Hypertension Father Arijana Narayan     Alcohol abuse Father Mazie Fencl     Arthritis Father Halee Glynn     Diabetes Father Anessa Charley     Alcohol abuse Paternal Grandfather Claudean Slocumb     Early death Brother Layman Slocumb     Kidney failure Brother Layman Slocumb     Diabetes Brother Layman Slocumb     Obesity Sister Oddis Slocumb     Obesity Sister [facility]      Dementia Maternal Grandmother Zelda Glatter     Migraines Paternal Grandmother Waddell Slocumb

## 2023-07-02 ENCOUNTER — Encounter (INDEPENDENT_AMBULATORY_CARE_PROVIDER_SITE_OTHER): Payer: Self-pay | Admitting: Registered"

## 2023-07-08 ENCOUNTER — Ambulatory Visit (INDEPENDENT_AMBULATORY_CARE_PROVIDER_SITE_OTHER): Admitting: Registered"

## 2023-07-08 DIAGNOSIS — Z713 Dietary counseling and surveillance: Secondary | ICD-10-CM

## 2023-07-08 NOTE — Patient Instructions (Signed)
 Below are the instructions for the required vitamin/mineral supplementation for those patients having the gastric bypass or gastric sleeve.  In addition, there is a review of the 3 week post-operative liquid diet you will need to follow after your surgical procedure.  Please ask your surgeon, nurse practitioner, or dietitian any questions that you may have.      Note:  You can purchase vitamins from our offices OR over-the-counter. Please ask your dietitian for advice on what store-bought brands are appropriate for you.    Remember, all vitamin/mineral supplements and medications (those larger than a pencil eraser) need to be in a chewable, crushable or liquid form for 2 months after your surgery.  Do not take gummy vitamins, or use vitamin patches. Consider purchasing a pill organizer to help you remember to take your supplements.     Do not take any vitamin/mineral supplements until you're TWO WEEKS POST OP, unless otherwise directed by your bariatric team.      Good luck, and we'll see you after your surgery!       Vitamin/Mineral Supplements:  Complete Multivitamin - 2/day of an everyday complete adult multivitamin (over-the-counter tablet/capsule), OR a high potency bariatric multivitamin such as Bariatric Advantage chewy bites (two per day) or Ultra Solo (once per day)  Calcium Citrate - _1,000_mg/day_.  Take in two separate doses of 500 mg.  Separate each dose by at least two hours.     Sublingual Vitamin B12 - 1,000 mcg/day.  This small tablet should be placed under the tongue and allowed to dissolve.  Do not swallow.   B50 Complex - 1 capsule/day.  This is a large capsule, and will need to be opened and sprinkled into a few tablespoons of liquid or sugar-free gelatin.  This vitamin does not taste good, so do not add it to your entire protein supplement or large glass of liquid - the entire drink will taste bad. This vitamin can make your urine turn bright yellow for a period of time after taking it. This is  normal.  Iron - _36 mg/day__.  This should be taken at least 2 hours away from a calcium dose.  You do not need additional iron IF your multivitamin contains the amount listed here.   Vitamin D3 - finish prescription, but open capsule for first two months after surgery.  Protein Supplement Drinks - 50 g/day (about 2 drinks)  for women and 65 g/day (about 2-3 drinks) for men.  Consume this amount of protein just from supplements, even when you start eating consistent meals.           Sample Vitamin/Mineral Supplement Schedule:  Morning:  Multivitamin, Vitamin B12, B50 Complex   Mid-morning:  1st calcium dose + Vitamin D   Afternoon (2 hrs from previous calcium dose):  2nd calcium dose  Bedtime:  2nd Multivitamin (if indicated), Iron (if not already in multivitamin, at least two hours from last calcium dose, and any calcium-containing protein supplement.     Post-Op 3 Week Liquid Diet Review:  When you return home from surgery, resume your protein drinks and begin drinking 64 oz of clear, sugar-free, caffeine-free fluids daily.  Sip fluids slowly all day long.  Also, review your post operative liquid phase instructions in the book  you were provided in the Pre-Op Nutrition Class.      Day 1-14 post-operative clear liquid diet: (DO NOT TAKE VITAMINS)  Water  Crystal light or Sugar-Free Kool-Aid  MIO  Decaf coffee and tea, herbal tea  Low/no-calorie sports drinks (G2, Gatorade Zero, Powerade Zero, Vitamin Water Zero, Propel, Bai, etc.)  Low sodium broth or protein broth like Unjury Chicken Soup Protein Supplement  Sugar-free gelatin, sugar-free popsicles  NO vitamin/mineral supplements    Consume your 50-65 g of protein from your shakes in addition to these clear fluids.  Your protein drinks may not be clear, but its essential that you continue to drink them.    Day 15-21 Full liquid diet:  You may now add the following drinks (in addition to your 64 oz clear liquids and protein drinks):   Fat free or 1% milk (Lactaid  milk is also acceptable) or unsweetened milk substitutes such as almond milk  Sugar-free puddings (if homemade, use skim milk)  Fat-free or low-fat yogurt (without any fruit chunks or seeds) with less than 12g sugar/serving  Strained low-fat creamed soups or pureed soups (must be drinkable)  Vegetable juice (low sodium is best)  Sugar-free popsicles & sugar-free gelatin  Cream of Wheat (only if made to a pourable consistency)  Start taking all vitamin/mineral supplements.    Remember, try to aim for 64 oz of fluids in addition to your protein supplements.  If you are having trouble getting in all 64 oz, consume protein drinks first.  We will review the mushy diet at your first post-operative appointment.

## 2023-07-08 NOTE — Progress Notes (Signed)
 S:  Pt is preparing for bariatric SG surgery with Dr Marge on 07/14/23.  Pt presents for pre-operative review appointment to discuss post-operative diet stages and vitamin/mineral requirements.      Pt states hx of the following vitamin deficiencies: vit D   Pt reports currently taking the following supplements at this time:  Taking magnesium , vit D      Pt has started liquid diet at this time.  Pt has been on the pre-op diet since 06/30/23. They report some loose stools which have improved at this time.     O:    Today's Wt:    261lb 3.2oz    Previous Wt:    Wt Readings from Last 10 Encounters:   06/30/23 264 lb   06/17/23 264 lb   05/18/23 258 lb   05/13/23 258 lb   04/29/23 262 lb 6.4 oz   04/27/23 261 lb   04/21/23 259 lb   04/14/23 265 lb 12.8 oz   04/07/23 263 lb   04/01/23 259 lb     Pertinent lab values:   Latest Reference Range & Units 04/29/23 09:59   Vitamin D , 25-OH, Total 30 - 100 ng/mL 16 (L)   (L): Data is abnormally low  A:  Per recent lab work, pt has vti D deficiency which has already been addressed by PCP. Answered pt questions and pt verbalized understanding and desired compliance with post-operative diet.    Pt provided instructions via AVS (printed and provided to pt) detailing vitamin/mineral regimen and first three weeks of post-op diet.     Pre operative nutrition quiz reviewed.    P:  1.  Pt to start on post operative three week liquid diet upon discharge from hospital and continue until directed by RD/physician at first post-operative in office appointment.  2.  Pt to start the following chewable or liquid vitamins/minerals two weeks post-op:     -Complete Adult Multivitamin and Mineral:  one standard multivitamin BID or a complete bariatric multivitamin     -500 mg Calcium Citrate, BID     -1,000 mcg Vitamin B12 sublingually daily     -Vitamin B 50 Complex once daily (open if capsule)     -Iron, 36 mg daily     -Vitamin D3, complete rx, but open capsule for first two months.     3.  Pt  is not to take any large pills (larger than a pencil eraser) whole for the first two months post-op, and all large pills must be switched to chewable, crushed, or liquid/capsules must be opened. Pt to address any medication concerns w/surgeon and/or prescribing provider.  4.  Pt advised to call with any questions - contact information given.  F/u in 10-14 post operatively or PRN.    Spent a total of 15 minutes educating pt in a individual one-on-one setting.  Plan reviewed with bariatric team.

## 2023-07-14 ENCOUNTER — Inpatient Hospital Stay
Admission: RE | Admit: 2023-07-14 | Discharge: 2023-07-16 | DRG: 621 | Disposition: A | Discharge status: Home / Self Care | Attending: Surgery | Admitting: Surgery

## 2023-07-14 ENCOUNTER — Ambulatory Visit

## 2023-07-14 ENCOUNTER — Encounter: Payer: Self-pay | Admitting: Surgery

## 2023-07-14 ENCOUNTER — Encounter
Admission: RE | Disposition: A | Discharge status: Home / Self Care | Payer: Self-pay | Source: Home / Self Care | Attending: Surgery

## 2023-07-14 DIAGNOSIS — F32A Depression, unspecified: Secondary | ICD-10-CM | POA: Diagnosis present

## 2023-07-14 DIAGNOSIS — E7849 Other hyperlipidemia: Secondary | ICD-10-CM

## 2023-07-14 DIAGNOSIS — E559 Vitamin D deficiency, unspecified: Secondary | ICD-10-CM | POA: Diagnosis present

## 2023-07-14 DIAGNOSIS — G4733 Obstructive sleep apnea (adult) (pediatric): Secondary | ICD-10-CM | POA: Diagnosis present

## 2023-07-14 DIAGNOSIS — E785 Hyperlipidemia, unspecified: Secondary | ICD-10-CM | POA: Diagnosis present

## 2023-07-14 DIAGNOSIS — F419 Anxiety disorder, unspecified: Secondary | ICD-10-CM | POA: Diagnosis present

## 2023-07-14 DIAGNOSIS — Z6838 Body mass index (BMI) 38.0-38.9, adult: Secondary | ICD-10-CM

## 2023-07-14 DIAGNOSIS — G43909 Migraine, unspecified, not intractable, without status migrainosus: Secondary | ICD-10-CM | POA: Diagnosis present

## 2023-07-14 HISTORY — PX: LAPAROSCOPIC, OMENTOPEXY: SHX00100

## 2023-07-14 HISTORY — PX: ROBOT XI ASSISTED,LAPAROSCOPIC,SLEEVE GASTRECTOMY: SHX6092

## 2023-07-14 LAB — URINE HCG QUALITATIVE: Urine HCG Qualitative: NEGATIVE

## 2023-07-14 SURGERY — ROBOT XI ASSISTED, LAPAROSCOPIC, SLEEVE GASTRECTOMY
Anesthesia: Anesthesia General | Site: Abdomen | Wound class: Clean Contaminated

## 2023-07-14 MED ORDER — ENOXAPARIN SODIUM 30 MG/0.3ML IJ SOSY
PREFILLED_SYRINGE | INTRAMUSCULAR | Status: AC
Start: 2023-07-14 — End: 2023-07-14
  Filled 2023-07-14: qty 0.3

## 2023-07-14 MED ORDER — ENOXAPARIN SODIUM 40 MG/0.4ML IJ SOSY
PREFILLED_SYRINGE | INTRAMUSCULAR | Status: AC
Start: 2023-07-14 — End: 2023-07-14
  Filled 2023-07-14: qty 0.4

## 2023-07-14 MED ORDER — LIDOCAINE HCL (PF) 1 % IJ SOLN
INTRAMUSCULAR | Status: DC | PRN
Start: 2023-07-14 — End: 2023-07-14
  Administered 2023-07-14: 100 mg via INTRAVENOUS

## 2023-07-14 MED ORDER — DEXTROSE 10 % IV BOLUS
12.5000 g | INTRAVENOUS | Status: DC | PRN
Start: 2023-07-14 — End: 2023-07-16

## 2023-07-14 MED ORDER — ROCURONIUM BROMIDE 10 MG/ML IV SOLN (WRAP)
INTRAVENOUS | Status: DC | PRN
Start: 2023-07-14 — End: 2023-07-14
  Administered 2023-07-14: 50 mg via INTRAVENOUS

## 2023-07-14 MED ORDER — FENTANYL CITRATE (PF) 50 MCG/ML IJ SOLN (WRAP)
25.0000 ug | INTRAMUSCULAR | Status: DC | PRN
Start: 2023-07-14 — End: 2023-07-14
  Administered 2023-07-14: 25 ug via INTRAVENOUS
  Filled 2023-07-14: qty 1

## 2023-07-14 MED ORDER — METOPROLOL TARTRATE 5 MG/5ML IV SOLN
INTRAVENOUS | Status: DC | PRN
Start: 2023-07-14 — End: 2023-07-14
  Administered 2023-07-14: 1 mg via INTRAVENOUS

## 2023-07-14 MED ORDER — BUPIVACAINE LIPOSOME 1.3 % IJ SUSP
INTRAMUSCULAR | Status: AC
Start: 2023-07-14 — End: 2023-07-14
  Filled 2023-07-14: qty 20

## 2023-07-14 MED ORDER — FAMOTIDINE 10 MG/ML IV SOLN (WRAP)
20.0000 mg | Freq: Two times a day (BID) | INTRAVENOUS | Status: DC
Start: 2023-07-14 — End: 2023-07-16
  Administered 2023-07-14 – 2023-07-16 (×4): 20 mg via INTRAVENOUS
  Filled 2023-07-14 (×4): qty 2

## 2023-07-14 MED ORDER — HYDRALAZINE HCL 20 MG/ML IJ SOLN
10.0000 mg | Freq: Four times a day (QID) | INTRAMUSCULAR | Status: DC | PRN
Start: 2023-07-14 — End: 2023-07-16

## 2023-07-14 MED ORDER — PROPOFOL 10 MG/ML IV EMUL (WRAP)
INTRAVENOUS | Status: AC
Start: 2023-07-14 — End: 2023-07-14
  Filled 2023-07-14: qty 20

## 2023-07-14 MED ORDER — DEXTROSE 50 % IV SOLN
12.5000 g | INTRAVENOUS | Status: DC | PRN
Start: 2023-07-14 — End: 2023-07-16

## 2023-07-14 MED ORDER — ENOXAPARIN SODIUM 40 MG/0.4ML IJ SOSY
40.0000 mg | PREFILLED_SYRINGE | Freq: Every day | INTRAMUSCULAR | Status: DC
Start: 2023-07-15 — End: 2023-07-16
  Administered 2023-07-15 – 2023-07-16 (×2): 40 mg via SUBCUTANEOUS
  Filled 2023-07-14 (×2): qty 0.4

## 2023-07-14 MED ORDER — ONDANSETRON HCL 4 MG/2ML IJ SOLN
4.0000 mg | Freq: Once | INTRAMUSCULAR | Status: DC | PRN
Start: 2023-07-14 — End: 2023-07-14
  Filled 2023-07-14: qty 2

## 2023-07-14 MED ORDER — SIMETHICONE 80 MG PO CHEW
80.0000 mg | CHEWABLE_TABLET | Freq: Four times a day (QID) | ORAL | Status: DC | PRN
Start: 2023-07-15 — End: 2023-07-16

## 2023-07-14 MED ORDER — ENOXAPARIN SODIUM 40 MG/0.4ML IJ SOSY
40.0000 mg | PREFILLED_SYRINGE | Freq: Once | INTRAMUSCULAR | Status: AC
Start: 2023-07-14 — End: 2023-07-14
  Administered 2023-07-14: 40 mg via SUBCUTANEOUS

## 2023-07-14 MED ORDER — LACTATED RINGERS IV SOLN
INTRAVENOUS | Status: DC | PRN
Start: 2023-07-14 — End: 2023-07-14

## 2023-07-14 MED ORDER — STERILE WATER FOR INJECTION IJ/IV SOLN (WRAP)
2.0000 g | Status: AC
Start: 2023-07-14 — End: 2023-07-14
  Administered 2023-07-14: 2 g via INTRAVENOUS

## 2023-07-14 MED ORDER — SODIUM CHLORIDE 0.9% BAG (IRRIGATION USE)
INTRAVENOUS | Status: DC | PRN
Start: 2023-07-14 — End: 2023-07-14
  Administered 2023-07-14: 1000 mL

## 2023-07-14 MED ORDER — GLUCOSE 40 % PO GEL (WRAP)
15.0000 g | ORAL | Status: DC | PRN
Start: 2023-07-14 — End: 2023-07-16

## 2023-07-14 MED ORDER — PROCHLORPERAZINE EDISYLATE 10 MG/2ML IJ SOLN
5.0000 mg | Freq: Four times a day (QID) | INTRAMUSCULAR | Status: DC | PRN
Start: 2023-07-14 — End: 2023-07-16
  Administered 2023-07-14: 5 mg via INTRAVENOUS
  Filled 2023-07-14: qty 2

## 2023-07-14 MED ORDER — ACETAMINOPHEN 10 MG/ML IV SOLN
INTRAVENOUS | Status: AC
Start: 2023-07-14 — End: 2023-07-14
  Filled 2023-07-14: qty 200

## 2023-07-14 MED ORDER — ONDANSETRON HCL 4 MG/2ML IJ SOLN
4.0000 mg | Freq: Once | INTRAMUSCULAR | Status: DC | PRN
Start: 2023-07-14 — End: 2023-07-14

## 2023-07-14 MED ORDER — HYDROMORPHONE HCL 1 MG/ML IJ SOLN
0.5000 mg | INTRAMUSCULAR | Status: DC | PRN
Start: 2023-07-14 — End: 2023-07-14
  Administered 2023-07-14 (×2): 0.5 mg via INTRAVENOUS
  Filled 2023-07-14 (×2): qty 0.5

## 2023-07-14 MED ORDER — ONDANSETRON HCL 4 MG/2ML IJ SOLN
INTRAMUSCULAR | Status: DC | PRN
Start: 2023-07-14 — End: 2023-07-14
  Administered 2023-07-14: 4 mg via INTRAVENOUS

## 2023-07-14 MED ORDER — BUPIVACAINE HCL (PF) 0.5 % IJ SOLN
INTRAMUSCULAR | Status: AC
Start: 2023-07-14 — End: 2023-07-14
  Filled 2023-07-14: qty 30

## 2023-07-14 MED ORDER — BUPIVACAINE LIPOSOME 1.3 % IJ SUSP
INTRAMUSCULAR | Status: DC | PRN
Start: 2023-07-14 — End: 2023-07-14
  Administered 2023-07-14: 20 mL

## 2023-07-14 MED ORDER — ACETAMINOPHEN 10 MG/ML IV SOLN
INTRAVENOUS | Status: DC | PRN
Start: 2023-07-14 — End: 2023-07-14
  Administered 2023-07-14: 1000 mg via INTRAVENOUS

## 2023-07-14 MED ORDER — DIPHENHYDRAMINE HCL 50 MG/ML IJ SOLN
12.5000 mg | Freq: Four times a day (QID) | INTRAMUSCULAR | Status: DC | PRN
Start: 2023-07-14 — End: 2023-07-16

## 2023-07-14 MED ORDER — PROPOFOL INFUSION 10 MG/ML
INTRAVENOUS | Status: DC | PRN
Start: 2023-07-14 — End: 2023-07-14
  Administered 2023-07-14: 200 mg via INTRAVENOUS
  Administered 2023-07-14: 100 mg via INTRAVENOUS

## 2023-07-14 MED ORDER — METOCLOPRAMIDE HCL 5 MG/ML IJ SOLN
10.0000 mg | Freq: Four times a day (QID) | INTRAMUSCULAR | Status: AC
Start: 2023-07-14 — End: 2023-07-15
  Administered 2023-07-14 – 2023-07-15 (×3): 10 mg via INTRAVENOUS
  Filled 2023-07-14 (×3): qty 2

## 2023-07-14 MED ORDER — OXYCODONE HCL 5 MG/5ML PO SOLN
5.0000 mg | ORAL | Status: DC | PRN
Start: 2023-07-14 — End: 2023-07-16

## 2023-07-14 MED ORDER — SUGAMMADEX SODIUM 200 MG/2ML IV SOLN
INTRAVENOUS | Status: DC | PRN
Start: 2023-07-14 — End: 2023-07-14
  Administered 2023-07-14: 200 mg via INTRAVENOUS

## 2023-07-14 MED ORDER — ACETAMINOPHEN 160 MG/5ML PO SUSP/SOLN (WRAP)
650.0000 mg | ORAL | Status: DC
Start: 2023-07-15 — End: 2023-07-16
  Administered 2023-07-15 – 2023-07-16 (×6): 650 mg via ORAL
  Filled 2023-07-14 (×9): qty 20.3

## 2023-07-14 MED ORDER — MIDAZOLAM HCL 1 MG/ML IJ SOLN (WRAP)
INTRAMUSCULAR | Status: DC | PRN
Start: 2023-07-14 — End: 2023-07-14
  Administered 2023-07-14: 2 mg via INTRAVENOUS

## 2023-07-14 MED ORDER — LACTATED RINGERS IR SOLN
Status: DC | PRN
Start: 2023-07-14 — End: 2023-07-14
  Administered 2023-07-14: 1000 mL

## 2023-07-14 MED ORDER — FENTANYL CITRATE (PF) 50 MCG/ML IJ SOLN (WRAP)
INTRAMUSCULAR | Status: DC | PRN
Start: 2023-07-14 — End: 2023-07-14
  Administered 2023-07-14 (×2): 25 ug via INTRAVENOUS
  Administered 2023-07-14: 50 ug via INTRAVENOUS

## 2023-07-14 MED ORDER — HYDROMORPHONE HCL 1 MG/ML IJ SOLN
0.5000 mg | INTRAMUSCULAR | Status: DC | PRN
Start: 2023-07-14 — End: 2023-07-16
  Administered 2023-07-14: 0.5 mg via INTRAVENOUS
  Filled 2023-07-14: qty 0.5

## 2023-07-14 MED ORDER — FENTANYL CITRATE (PF) 50 MCG/ML IJ SOLN (WRAP)
25.0000 ug | INTRAMUSCULAR | Status: DC | PRN
Start: 2023-07-14 — End: 2023-07-14
  Administered 2023-07-14 (×3): 25 ug via INTRAVENOUS
  Filled 2023-07-14: qty 1

## 2023-07-14 MED ORDER — GLUCAGON 1 MG IJ SOLR (WRAP)
1.0000 mg | INTRAMUSCULAR | Status: DC | PRN
Start: 2023-07-14 — End: 2023-07-16

## 2023-07-14 MED ORDER — SODIUM CHLORIDE (PF) 0.9 % IJ SOLN
INTRAMUSCULAR | Status: AC
Start: 2023-07-14 — End: 2023-07-14
  Filled 2023-07-14: qty 10

## 2023-07-14 MED ORDER — MIDAZOLAM HCL 1 MG/ML IJ SOLN (WRAP)
INTRAMUSCULAR | Status: AC
Start: 2023-07-14 — End: 2023-07-14
  Filled 2023-07-14: qty 2

## 2023-07-14 MED ORDER — METOCLOPRAMIDE HCL 5 MG/ML IJ SOLN
10.0000 mg | Freq: Once | INTRAMUSCULAR | Status: DC | PRN
Start: 2023-07-14 — End: 2023-07-14

## 2023-07-14 MED ORDER — HYOSCYAMINE SULFATE 0.125 MG SL SUBL
0.1250 mg | SUBLINGUAL_TABLET | Freq: Four times a day (QID) | SUBLINGUAL | Status: DC | PRN
Start: 2023-07-14 — End: 2023-07-16
  Administered 2023-07-14 – 2023-07-16 (×3): 0.125 mg via SUBLINGUAL
  Filled 2023-07-14 (×3): qty 1

## 2023-07-14 MED ORDER — HYDROMORPHONE HCL 1 MG/ML IJ SOLN
0.5000 mg | INTRAMUSCULAR | Status: DC | PRN
Start: 2023-07-14 — End: 2023-07-14

## 2023-07-14 MED ORDER — LIDOCAINE HCL (PF) 2 % IJ SOLN
INTRAMUSCULAR | Status: AC
Start: 2023-07-14 — End: 2023-07-14
  Filled 2023-07-14: qty 5

## 2023-07-14 MED ORDER — FENTANYL CITRATE (PF) 50 MCG/ML IJ SOLN (WRAP)
INTRAMUSCULAR | Status: AC
Start: 2023-07-14 — End: 2023-07-14
  Filled 2023-07-14: qty 2

## 2023-07-14 MED ORDER — GABAPENTIN 300 MG PO CAPS
300.0000 mg | ORAL_CAPSULE | Freq: Three times a day (TID) | ORAL | Status: DC
Start: 2023-07-14 — End: 2023-07-16
  Administered 2023-07-15 – 2023-07-16 (×3): 300 mg via ORAL
  Filled 2023-07-14 (×4): qty 1

## 2023-07-14 MED ORDER — SODIUM CHLORIDE BACTERIOSTATIC 0.9 % IJ SOLN
INTRAMUSCULAR | Status: DC | PRN
Start: 2023-07-14 — End: 2023-07-14
  Administered 2023-07-14: 40 mL via INTRAMUSCULAR

## 2023-07-14 MED ORDER — ONDANSETRON HCL 4 MG/2ML IJ SOLN
4.0000 mg | Freq: Three times a day (TID) | INTRAMUSCULAR | Status: DC | PRN
Start: 2023-07-14 — End: 2023-07-16
  Administered 2023-07-14 – 2023-07-16 (×4): 4 mg via INTRAVENOUS
  Filled 2023-07-14 (×4): qty 2

## 2023-07-14 MED ORDER — ROCURONIUM BROMIDE 50 MG/5ML IV SOLN
INTRAVENOUS | Status: AC
Start: 2023-07-14 — End: 2023-07-14
  Filled 2023-07-14: qty 5

## 2023-07-14 MED ORDER — CEFAZOLIN SODIUM 1 G IJ SOLR
INTRAMUSCULAR | Status: AC
Start: 2023-07-14 — End: 2023-07-14
  Filled 2023-07-14: qty 2000

## 2023-07-14 MED ORDER — LACTATED RINGERS IV SOLN
INTRAVENOUS | Status: AC
Start: 2023-07-14 — End: 2023-07-14
  Filled 2023-07-14: qty 1000

## 2023-07-14 MED ORDER — SCOPOLAMINE 1 MG/3DAYS TD PT72
1.0000 | MEDICATED_PATCH | TRANSDERMAL | Status: DC | PRN
Start: 2023-07-14 — End: 2023-07-16
  Administered 2023-07-14: 1 via TRANSDERMAL
  Filled 2023-07-14: qty 1

## 2023-07-14 MED ORDER — METOPROLOL TARTRATE 5 MG/5ML IV SOLN
INTRAVENOUS | Status: AC
Start: 2023-07-14 — End: 2023-07-14
  Filled 2023-07-14: qty 5

## 2023-07-14 MED ORDER — ALBUTEROL SULFATE 1.25 MG/3ML IN NEBU
1.2500 mg | INHALATION_SOLUTION | RESPIRATORY_TRACT | Status: DC | PRN
Start: 2023-07-14 — End: 2023-07-16

## 2023-07-14 MED ORDER — ACETAMINOPHEN 10 MG/ML IV SOLN
1000.0000 mg | Freq: Four times a day (QID) | INTRAVENOUS | Status: AC
Start: 2023-07-14 — End: 2023-07-15
  Administered 2023-07-14 – 2023-07-15 (×3): 1000 mg via INTRAVENOUS
  Filled 2023-07-14 (×3): qty 100

## 2023-07-14 MED ORDER — POTASSIUM CHLORIDE IN NACL 20-0.9 MEQ/L-% IV SOLN
INTRAVENOUS | Status: DC
Start: 2023-07-14 — End: 2023-07-16
  Filled 2023-07-14: qty 1000

## 2023-07-14 MED ORDER — OXYCODONE HCL 5 MG PO TABS
5.0000 mg | ORAL_TABLET | Freq: Once | ORAL | Status: DC | PRN
Start: 2023-07-14 — End: 2023-07-14

## 2023-07-14 SURGICAL SUPPLY — 47 items
ADHESIVE SKIN CLOSURE DERMABOND ADVANCED .7 ML LIQUID APPLICATOR (Skin Closure) ×1 IMPLANT
APPLICATOR CHLORAPREP 26 ML 70% ISOPROPYL ALCOHOL 2% CHLORHEXIDINE (Applicator) ×2 IMPLANT
BLADE SURGICAL CLIPPER PIVOT ADJUSTABLE HEAD 9661 PURPLE (Blade) IMPLANT
CATHETER IV OD14 GA L2 IN RADIOPAQUE JELCO (IV Supply) ×1 IMPLANT
CLIP SUTURE ABSORBABLE LAPRA-TY VICRYL POLYDIOXANONE (Suture) ×1 IMPLANT
COVER EQUIPMENT MEDLINE SMS L53 IN X W24 IN MAYOSTAND (Drape) ×1 IMPLANT
COVER FLEXIBLE LIGHT HANDLE PLASTIC GREEN (Procedure Accessories) ×1 IMPLANT
DRAPE COLUMN EQUIPMENT DA VINCI XI (Drape) ×1 IMPLANT
DRAPE EQUIPMENT ARM L21 IN X W19 IN X H10.5 IN DA VINCI XI 21 LB (Drape) ×4 IMPLANT
DRAPE SURGICAL SHEET L77 IN X W53 IN MEDLINE SMS 3/4 BLUE (Drape) ×1 IMPLANT
ELECTRODE ADULT PATIENT RETURN L9 FT REM POLYHESIVE ACRYLIC FOAM (Procedure Accessories) ×1 IMPLANT
GLOVE SURGICAL 7 1/2 BIOGEL PI INDICATOR UNDERGLOVE POWDER FREE SMOOTH (Glove) IMPLANT
GLOVE SURGICAL 7 1/2 BIOGEL PI ULTRATOUCH M POWDER FREE BEAD CUFF (Glove) IMPLANT
GLOVE SURGICAL 7 BIOGEL PI INDICATOR UNDERGLOVE POWDER FREE SMOOTH (Glove) ×1 IMPLANT
GLOVE SURGICAL 7 BIOGEL PI POWDER FREE MICRO ROUGHEN BEAD CUFF (Glove) ×1 IMPLANT
GOWN SURGICAL XL SMARTGOWN LEVEL 4 BREATHABLE (Gown) ×1 IMPLANT
HANDLE LIGHT SNAP ON ASPEN (Procedure Accessories) ×1 IMPLANT
IRRIGATOR SUCTION ERGONOMIC HAND PIECE STRYKEFLOW II (Suction) ×1 IMPLANT
KIT INFECTION CONTROL CUSTOM IFOH03 (Kits) ×1 IMPLANT
MAT PATIENT L81 IN X W39 IN M2 MICROCLIMATE BODY PAD PREVALON MOBILE (Positioning Supplies) ×1 IMPLANT
NEEDLE SPINAL L3 1/2 IN REGULAR WALL QUINCKE TIP OD20 GA BD (Needles) ×1 IMPLANT
PACK SURGICAL BASIN SET FOAK MEDLINE INDUSTRIES, INC. (Other) ×1 IMPLANT
PACK SURGICAL GENERAL LAP SHARE MEDLINE INDUSTRIES, INC (Tray) ×1 IMPLANT
PACK SURGICAL GENERAL LAP SHARE MEDLINE INDUSTRIES, INC. (Tray) ×1 IMPLANT
REDUCER CANNULA ROBOTIC STANDARD DA VINCI XI ENDOWRIST 12-8MM 470381 (Instrument) IMPLANT
RELOAD STAPLER SUREFORM 60 DISPOSABLE BLUE (Staplers) ×4 IMPLANT
RELOAD STAPLER SUREFORM 60 GREEN (Staplers) ×1 IMPLANT
SEAL DAVINCI UNIVERSAL ID5-12 MM CANNULA (Procedure Accessories) ×4 IMPLANT
SEALER TISSUE DA VINCI SLIM JAW EXTEND DISPOSABLE (Instrument) ×1 IMPLANT
SET HIGH FLOW SMOKE EVACUATION PNEUMOCLEAR TUBING (Tubing) ×1 IMPLANT
SOLUTION IV LACTATED RINGERS 1000 ML PLASTIC CONTAINER (IV Solutions) ×1 IMPLANT
SPONGE LAPAROTOMY L18 IN X W18 IN 4 PLY RADIOPAQUE PYRONEMA FREE HIGH (Sponge) ×1 IMPLANT
STAPLER INTERNAL LINEAR CUTTER RELOADABLE DISP SUREFORM 60MM TITANIUM (Staplers) ×1 IMPLANT
STAPLER OBTURATOR ROBOTIC BLADELESS LONG DA VINCI XI ENDOWRIST 8MM (Instrument) ×1 IMPLANT
STAPLER SUREFORM 60 SEAMGUARD BIOABSORBABLE GREEN/BLUE (Sealant) ×3 IMPLANT
SUTURE COATED VICRYL 2-0 SH L27 IN BRAID COATED VIOLET ABSORBABLE (Suture) ×1 IMPLANT
SUTURE COATED VICRYL PLUS 0 L54 IN BRAID ANTIBACTERIAL COATED VIOLET (Suture) ×1 IMPLANT
SUTURE ETHIBOND EXCEL GREEN 2-0 SH L30 IN BRAID NONABSORBABLE (Suture) IMPLANT
SUTURE MONOCRYL PLUS 4-0 PS-2 L27 IN MONOFILAMENT ANTIBACTERIAL UNDYED (Suture) ×2 IMPLANT
SUTURE PASSER WECK EFX CLASSIC (Procedure Accessories) ×1 IMPLANT
SUTURE V-LOC 2-0 V-20 1/2 CIRCLE L6 IN NONABS BLUE (Suture) IMPLANT
SUTURE V-LOC BLUE 2-0 V-20 TAPER POINT L6 IN NONABSORBABLE (Suture) IMPLANT
SYRINGE 30 ML CONCENTRIC TIP GRADUATE NONPYROGENIC DEHP FREE LOK (Syringes, Needles) ×2 IMPLANT
SYSTEM GASTRIC LAVAGE SELECTION VALVE FENESTRATION PATTERN 107CM 36FR (Procedure Accessories) ×1 IMPLANT
SYSTEM GASTRIC LAVAGE VALVE FENESTRATION 107CM 50FR SLEEVE BULB (Procedure Accessories) ×1 IMPLANT
SYSTEM IMAGING 8X6IN CLEARIFY MICROFIBER WARM HUB TRCR WIPE DSPSBL (Kits) ×1 IMPLANT
TROCAR OD5 MM L100 MM BLADELESS STABLE SLEEVE ENDOPATH XCEL ENDOSCOPIC (Laparoscopy Supplies) ×1 IMPLANT

## 2023-07-14 NOTE — Plan of Care (Addendum)
 NURSE NOTE SUMMARY  [facility] - 5NEW ORTHOPEDICS   Patient Name: Tonya Camacho   Attending Physician: Marge Charmaine CROME, MD   Today's date:   07/14/2023 LOS: 0 days   Shift Summary:                                                                Discharge Barriers: Pain management and Diet tolerance    SNF Plan for discharge : no   Patient on Bowel Regimen: no   Last AF:empnm to surgery       A+0X:4  Vitals: WNL  Pain: Pain is manageable on current regimen Managed with scheduled tylenol  and prn dilaudid  Patient's Stated Comfort Functional Goal: 0-No pain  Dressing: clean, dry & intact  Incision site/wound assessment: abdomen   Dressing changed: no  Ambulating: yes (Steady Gait,  Gait belt)  Voiding: yes Clear Yellow urine  Tolerating diet: Bariatric   Nausea/Vomiting: yes x 2 of emesis   Passing Gas: no  BM: no       Bariatric : Patient able to tolerate 1 oz/hr without s/s of overfilling.     Is patient on Chew chew 2? no  Is patient on q2 Turn Team?  no        Net I&O for admission: Net IO Since Admission: 1,200 mL [07/14/23 1741]  Net I&O for past 24 hours:   Intake/Output Summary (Last 24 hours) at 07/14/2023 1741  Last data filed at 07/14/2023 1443  Gross per 24 hour   Intake 1500 ml   Output 300 ml   Net 1200 ml        Provider Notifications:      Rapid Response Notifications:  Mobility:      PMP Activity: Step 3 - Bed Mobility (07/14/2023 12:00 PM)     Weight tracking:  Family Dynamic:   Last 3 Weights for the past 72 hrs (Last 3 readings):   Weight   07/14/23 1200 114.1 kg (251 lb 8 oz)   07/14/23 0745 114.1 kg (251 lb 8 oz)             Recent Vitals Last Bowel Movement   BP: 141/88 (07/14/2023  5:35 PM)  Heart Rate: 83 (07/14/2023  5:35 PM)  Temp: 98.1 F (36.7 C) (07/14/2023  4:11 PM)  Resp Rate: 15 (07/14/2023 12:00 PM)  Height: 1.727 m (5' 8) (07/14/2023 12:00 PM)  Weight: 114.1 kg (251 lb 8 oz) (07/14/2023 12:00 PM)  SpO2: 100 % (07/14/2023  4:11 PM)   No data recorded         Problem: Day  of Surgery- Sleeve Gastrectomy Surgery  Goal: Stable vital signs and fluid balance  Outcome: Progressing  Flowsheets (Taken 07/14/2023 1240)  Stable vital signs and fluid balance:   Monitor vital signs   Apply telemetry monitor as ordered   Monitor/assess O2 saturation   Monitor intake and output. Notify LIP if urine output is less than 240 mL in 8 hours   Foley maintenance care if Foley in place. Utilize indwelling urinary catheter policy/protocol ([place]).   Monitor lab values. Notify LIP of abnormal results.   Administer and discontinue antibiotics per SCIP measures  Goal: Pain at adequate level  Outcome: Progressing  Flowsheets (Taken 07/14/2023 1240)  Pain at adequate level as identified by patient:   Administer Toradol  if prescribed   Monitor On-Q pump patency   Initiate and reinforce use of PCA, if ordered   Evaluate if patient comfort function goal is met   Identify patient comfort function goal  Goal: Effective ventilation maintained  Outcome: Progressing  Flowsheets (Taken 07/14/2023 1240)  Effective ventilation maintained:   Monitor/assess patient's mental status and LOC   Monitor/assess patient's respiratory status (RR, depth, effort, breath sounds)   Monitor/assess O2 saturation   Administer O2 therapy as ordered   Position patient for maximum ventilatory efficiency, head of bed at 30 degrees   Teach/reinforce use of incentive spirometer 10 times per hour while awake, cough and deep breath as needed   Apply CPAP as ordered during any periods of rest  Goal: Tissue perfusion is adequate  Outcome: Progressing  Flowsheets (Taken 07/14/2023 1240)  Tissue perfusion is adequate:   Teach/review/reinforce ankle pump exercises   VTE Prevention: administer anticoagulant(s) and/or apply anti-embolism stockings/devices as ordered   Assess and monitor skin color and temperature  Goal: Nutritional intake is adequate  Outcome: Progressing  Flowsheets (Taken 07/14/2023 1240)  Nutritional intake is adequate:   Provide sips of  water. Do not use straws.   Bariatric dietician consult as indicated for next day   Administer acid-reducer meds as prescribed   Assess GI status (bowel sounds, nausea/vomiting, distention, flatus)   Monitor intake and output  Goal: Mobility/activity is maintained at optimum level  Outcome: Progressing  Flowsheets (Taken 07/14/2023 1240)  Mobility/activity is maintained at optimum level:   Out of bed to chair if indicated with assistance as needed   Out of bed with assistance as needed   Ambulate at least once per shift with assistance

## 2023-07-14 NOTE — Anesthesia Postprocedure Evaluation (Signed)
 Anesthesia Post Evaluation    Patient: Tonya Camacho    ROBOT XI ASSISTED, LAPAROSCOPIC, SLEEVE GASTRECTOMY (Abdomen)  LAPAROSCOPIC, OMENTOPEXY (Abdomen)    Anesthesia type: general    Last Vitals:   Vitals Value Taken Time   BP 140/88 07/14/23 11:40   Temp 36.2 C (97.1 F) 07/14/23 11:30   Pulse 81 07/14/23 11:40   Resp 18 07/14/23 11:40   SpO2 97 % 07/14/23 11:40                 Anesthesia Post Evaluation:     Patient Evaluated: PACU  Patient Participation: complete - patient participated  Level of Consciousness: awake and alert  Pain Score: 0  Pain Management: adequate  Multimodal analgesia pain management approach  Strategies: acetaminophen  and local anesthesia  Airway Patency: patent  Two or more mitigation strategies used for obstructive sleep apnea.  Strategies: extubation/recovery carried out in nonsupine position, verification of full NMB reversal and multimodal analgesia    Anesthetic complications: No      PONV Status: none    Cardiovascular status: acceptable  Respiratory status: acceptable  Hydration status: acceptable        Signed by: Okey MARLA Kays, MD, 07/14/2023 2:22 PM

## 2023-07-14 NOTE — Op Note (Signed)
 FULL OPERATIVE NOTE    Date Time: 07/14/23 10:28 AM  Patient Name: Tonya Camacho, Tonya Camacho (MRN: [id number])  Attending Physician: Marge Charmaine CROME, MD      Date of Operation:   07/14/2023    Providers Performing:   Surgeons and Role:     DEWAINE Marge Charmaine CROME, MD - Primary    Surgical First Assistant(s):   First Assistant: Thyra Dorthea ROVER, who was essential and present throughout the procedure to help in   positioning, transfer, port placement, skin closure, retraction and   exposure during critical parts of the procedure, also exchanging instruments and sutures    Operative Procedure:   ROBOT XI ASSISTED, LAPAROSCOPIC, SLEEVE GASTRECTOMY: 43775 (CPT)  S2900    LAPAROSCOPIC, OMENTOPEXY: 49326 (CPT)      Preoperative Diagnosis:   Pre-Op Diagnosis Codes:      * Morbid obesity (CMS/HCC) [E66.01]  OSA  Anxiety  Depression  Migraines  Pure hyperlipidemia  Vitamin D  deficiency    Postoperative Diagnosis:   Post-Op Diagnosis Codes:     * Morbid obesity (CMS/HCC) [E66.01]  OSA  Anxiety  Depression  Migraines  Pure hyperlipidemia  Vitamin D  deficiency    Anesthesia:   General    Findings:   Hiatal hernia was not present.  The sleeve was created using a 36-French VisiGi bougie    Indications:   Tonya Camacho is a 38 y.o. morbidly obese female who has failed multiple previous attempts at conservative weight loss who opted to proceed with robotic laparoscopic sleeve gastrectomy as a surgical weight loss option understanding all other options including gastric bypass and malabsorptive procedures and gastric banding.  After an extensive preoperative education workup, psychiatric and dietary counseling, meeting all the preoperative clearance criteria and being cleared by medicine was cleared and scheduled for surgery. For a detailed explanation of risks please refer to the hospital and/or office chart.    Operative Notes:    The patient was transferred from the ambulatory care unit to the OR, was  placed in  supine position on the OR table.  After general endotracheal  intubation, the abdomen was prepped and draped in sterile fashion with  ChloraPrep.       Using a 15 blade, a small incision was made at the left lateral aspect of  the umbilicus.  An 8 mm da Vinci trocar with a 0-degree 5 mm laparoscope  was then used to enter the abdominal cavity under direct vision.  The  abdomen was insufflated to 15 mmHg with CO2.  We proceeded to give a laparoscopic tap block with a mixture of 20 mL of Exparel, 30 mm of plain 0.5 Marcaine, and 10 mL of normal saline on the bilateral subcostal and flank areas.  On the right lateral aspect  of the umbilicus approximately 4 fingerbreadths away, a 12 mm da Vinci  trocar was then inserted.   Two 8 mm da Vinci trocars were then inserted, one at  the left mid axillary line and one at the midclavicular line, both at the  level of the umbilicus. A Nathanson liver retractor was also inserted to  elevate the left lobe of the liver, which was placed into position and  locked.       At this point, the robot was brought in from the left side of the patient  and after the patient was placed in a reverse Trendelenburg, the robot was  docked.  I proceeded to the robotic console.  I proceeded to dissect the  area at the angle of His to expose the left crus.  This was done using the  vessel sealer and blunt dissection.  The anterior fat pad was then taken off using the vessel sealer.   At this point, we proceeded to take down the gastrocolic ligament about 4  cm from the pylorus.  Using the vessel sealer, the lesser sac was entered  and then the gastrocolic ligament along with the short gastrics were taken  down all the way to the angle of His.  This area was dissected so the left crus could be seen all the way to the junction of the right crus..  Using the da  Vinci 60 mm green load stapler with SEAMGUARD, I proceeded to staple 4 cm from the pylorus.   After the first staple, a  36-French bougie was  then fed down and passed through  the staple line.  I proceeded with 1 60 mm blueda Vinci stapler with SEAMGUARD to get  around the angularis and then three blue 60 mm da Vinci staplers with [facility] were used to  create the sleeve along the 36 bougie.   A omental patch pexy of the staple line using a 3-0 absorbable barbed suture in a running fashion incorporating the staple line to the gastrocolic and gastro-splenic ligament.  The distal stomach  was then clamped as the sleeve was submerged under water, and a leak test was performed through the 36-French VisiGi bougie.  The abdomen was distended to 50 mmHg with air.  There was no evidence of any air leak noted. The air was then suctioned and the bougie was removed.         The rest of the abdomen was checked.  There were no gross abnormalities.   The sutures were then removed along with the fat pad at the angle of His,  which had been removed.  The robot was then undocked.  We proceeded to take  out the stomach through the 12 port without any difficulty.  The excised stomach was checked and all the staples were intact.  The 12 port  was closed using an 0 Vicryl suture with a Carter-Thomason, in a figure-of-eight fashion. The liver retractor was then removed.  The trochars were then taken out under direct vision.  There was no evidence any bleeding.  The abdomen was deflated.  Each port site was irrigated with  Saline.  The skin  at each port site was approximated using 4-0 Monocryl in subcuticular  fashion.  Dermabond was placed over the incision site.  The patient tolerated  the procedure well, was transferred to the recovery room stable.  All  counts were correct at the end of the procedure.              Estimated Blood Loss:   Minimal    Implants:     Implant Name Type Inv. Item Serial No. Model No. Manufacturer Lot No. LRB No. Used Action   STAPLER 60 INTUITIVE DA VINCI XI - [id number] Sealant STAPLER 60 INTUITIVE DA VINCI XI  [id number] W.LSABRA KRAFT [id number]  N/A 3 Implanted          Drains:   Drains: no      Specimens:     ID Type Source Tests Collected by Time Destination   1 : portion of stomach Tissue Stomach (Specify if applicable) SURGICAL PATHOLOGY Marge Charmaine CROME, MD 07/14/2023 1018          Complications:   None  Signed by: Charmaine LITTIE Hausen, MD  [facility]

## 2023-07-14 NOTE — Anesthesia Preprocedure Evaluation (Signed)
 Anesthesia Evaluation    AIRWAY    Mallampati: II    TM distance: >3 FB  Neck ROM: full  Mouth Opening:full  Planned to use difficult airway equipment: No CARDIOVASCULAR    cardiovascular exam normal       DENTAL    no notable dental hx               PULMONARY    pulmonary exam normal     OTHER FINDINGS                                    Relevant Problems   ANESTHESIA   (+) OSA (obstructive sleep apnea)      PULMONARY   (+) OSA (obstructive sleep apnea)               Anesthesia Plan    ASA 2     general               (Risks discussed including but not limited to:    - Neurological complications such as stroke, TIA  - Cardiovascular complications such as heart attack, Arrhythmias, Cardiac arrest   - Pulmonary complications such as Asthmatic attack,Pulm. Aspiration, Bronchospasm and Pneumonia  - Intra-operative awareness,  - Corneal abrasions and eye injuries,  - Dental Injuries  - Sore Throat  - Allergic reactions  - Death     Anesthesia explained and Questions answered.     Pt understands and wishes to proceed.  )      intravenous induction   Detailed anesthesia plan: general endotracheal        Post op pain management: per surgeon    informed consent obtained    Plan discussed with CRNA.    ECG reviewed  pertinent labs reviewed           Signed by: Okey MARLA Kays, MD 07/14/23 8:20 AM

## 2023-07-14 NOTE — Discharge Instr - AVS First Page (Addendum)
Drs. Moazzez, Nain, Pourshojae, and [place]  [address], [address]   [place], [place] [address]   (O) [phone]   (F) [phone]    [address], [address]   [place], [place] [address]   (O) [phone]   (F) [phone]       PATIENT DISCHARGE INFORMATION: BARIATRIC SURGERY    We are concerned about your health and have developed a few guidelines to help you during your hospitalization and at home. Please call your physician if you have any questions about your care when you get home.    DIET    Only sugar free, caffeine free, non carbonated liquids are allowed. Bariatric clear liquids for 2 weeks plus protein shakes, and then bariatric full liquids for the following week until advanced by your physician or dietician.  Make sure to take small sips all day long. Please refer to the diet guidelines provided by your dietitian. Do not eat solids.     Increase your liquid intake from a minimum of 6 to 8 cups a day as tolerated. Sip on water throughout the day. Ensure to get a minimum of 50 grams (for women) and 63 grams (men) of protein per day from your supplement.     The 30/30 rule (not drinking liquids for 30 minutes before meals or 30 minutes after meals) will apply after you are finished with your liquid diet.    Avoid foods high in sugar or fat. Maintain an adequate protein intake. Avoid nuts, popcorn, some citrus fruits, and soft breads for 2 months. Take chewable multivitamin supplement daily starting two weeks after your surgery date.     If vomiting occurs, and persists more than 24 hours, please notify your doctor.    ACTIVITY    Avoid heavy lifting (more than 20 pounds) or strenuous exercise for at least 4 weeks.    Walk daily, gradually increasing speed with a goal of 2 miles.  Ok to go up and down stairs.    MEDICATION    Follow the medication instructions as written by your doctor. Break all medications in half or crush unless advised differently from your doctor.    You should  refrain from drinking alcohol, you should refrain from driving if you are taking a narcotic medication for pain.    If the pain is unrelieved by the prescribed medication, please call you doctor.    Take all medications as prescribed.  - Please start taking your vitamins 2 weeks after surgery    - Take the Tylenol (650 mg or 20 mL) every four hours for pain control for the next 3-5 days and then as needed.  Suggested times are 7:00am, 11:00am, 3:00pm, and 7:00pm.  You can add another dose at 11:00pm if needed before bedtime . Do not exceed 4 grams (4000 mg) of tylenol total in 24 hours. The current dosing will give you 2600 mg- 3250 mg which is below the daily maximum.     - Take the gabapentin three times a day for the next 3-5 days and then as needed for pain.  This is for sharper pain.    - Take the oxycodone (or Dilaudid, whichever you were prescribed) as needed for severe pain.  This is a narcotic and has additive properties if used and used for a prolonged period of time.  You cannot operate heavy machinery (a vehicle) while on narcotics.  Contact your insurance company for official recommendations for when you can drive after discontinuing   narcotic medications.     - Take Prevacid (lansoprazole) or Prilosec (omeprazole), whichever you were prescribed, every day.  This is for acid and prevention of ulcers.  It is important to take this even if you don't feel like you're having acid reflux.    - Use Zofran (ondansetron) as needed for nausea.    - If you have received Lovenox (blood thinner), please use as instructed (2 -4 weeks)    - If you are prescribed URSODIOL (Actigall), start that medication 2 weeks after surgery.  This is for prevention of gallstones.  You can still develop gallstones but this therapy helps to limit this process with rapid weight loss.     - IF you experience constipation:   Drink at least 80 oz fluids daily  Walk regularly   Dulcolax suppository daily x3 days to get things  moving   Enemas, as needed, to soften hard stools   If at least 5 days out from surgery, may try Miralax  Once you start having softer stools, add in a fiber supplement daily (ie Metamucil, Benefiber, Citrucel-SF etc)  If you are unable to have a bowel movement and feeling abdominal pain and/or nausea/vomiting, please notify the office      INCISION    Keep your incision clean and dry. You may shower daily and pat your incision dry. No bathing or swimming for 4 weeks.    Check you incision daily and notify your doctor if you notice any redness or drainage.    It is not necessary to take your temperature routinely, but if you feel like you have a fever and it is 101 degrees Farenheit or more, please call your doctor.    OTHER CARE MEASURES    Continue to use incentive spirometer 10 times per hour while awake until your next doctor visit.  Make sure you are drinking enough and urinating at least 4-6 times per day.    Notify physician with:  -fever >101  -one sided calf pain  -vomiting  -increasing abdominal pain  -sudden onset shoulder pain    MAKE SURE YOU REMAIN HYDRATED.  You can keep water with you at all times and should sip to maintain hydration.     TENTATIVE SCHEDULE:    7:00am:  Morning water 6-8oz, Breakfast, Tylenol/Gabapentin  8:00am:  Daily Medications  9:00am:  Protein Shake/Lovenox  10:00am:  Vitamins  11:00am:  Water 6-8 oz, Tylenol  12:00pm:  Lunch  1:00pm:  Water 6-8 oz  2:00pm:  Water 6-8 oz  3:00pm:  Medications (Gabapentin/Tylenol)  4:00pm:  Water 6-8 oz  5:00pm:  Dinner  6:00pm:  Water 6-8 oz  7:00pm:  Multivitamin, Tylenol  9:00pm:  Water, Gabapentin, second Lovenox (if prescribed twice daily)  11:00pm: Tylenol if needed    IF YOU ARE TOLD BY A PHYSICIAN TO RETURN TO THE ER, PLEASE ONLY GO TO [facility] EMERGENCY DEPARTMENT. Thank you.     CONGRATULATIONS on your commitment to your success.  Fall in love with your success and JOIN US ON FACEBOOK!    This online community is  designed to be a site for support to all of our POST OPERATIVE bariatric patients.  This will connect you with our staff and our patients.  Follow the process and share your story.  YOU ARE NOT ALONE and we all need to be engaged to support and educate each other.  Welcome to the [facility] Family.    [email or web address]

## 2023-07-14 NOTE — H&P (Signed)
 Drs. Berlinda, [place], and Pourshojae   [address], [address]   [place], [place] [address]   [phone]   [phone]     [address], [address]  [place], [place] [address]   [phone]   [phone]    I have examined the patient and reviewed the H&P. No pertinent change in the patient's condition since the H&P was completed.  Pt is for Robotic/Laparoscopic sleeve gastrectomy, possible hiatal hernia repair, EGD.    Charmaine LITTIE Hausen, MD   8:45 AM 07/14/2023

## 2023-07-14 NOTE — Progress Notes (Signed)
 Admission Note-  Patient admitted to Surgical unit at 1152. Hand off report received fromPACU RN  Patient connected to tele monitor, confirmed with monitor tech.   Patient oriented to room, bed in lowest position, bed alarm activated, call bell within reach.   Fall prevention protocols reviewed with patient and visitor.  Patient informed of visitor policy.  Patient has home medications with them: No   Belongings reviewed and documented in Admission Navigator.  Patient has valuables that are required to be sent to security: No      Brief Clinical Picture:  Neuro AXO4; Resp - O2 statusWNL; Cardiac WNL    Skin Assessment    4 eyes in 4 hours pressure injury assessment note:      Completed with: Chrisitna   Unit & Time admitted: Surgical             Bony Prominences: Check appropriate box; if wound is present enter wound assessment in LDA     Occiput:                 [x] WNL  []  Wound present  Face:                     [x] WNL  []  Wound present  Ears:                      [x] WNL  []  Wound present  Spine:                    [x] WNL  []  Wound present  Shoulders:             [x] WNL  []  Wound present  Elbows:                  [x] WNL  []  Wound present  Sacrum/coccyx:     [x] WNL  []  Wound present  Ischial Tuberosity:  [x] WNL  []  Wound present  Trochanter/Hip:      [x] WNL  []  Wound present  Knees:                   [x] WNL  []  Wound present  Ankles:                   [x] WNL  []  Wound present  Heels:                    [x] WNL  []  Wound present  Other pressure areas:  []  Wound location     Scattered tattoos

## 2023-07-15 ENCOUNTER — Other Ambulatory Visit: Payer: Self-pay

## 2023-07-15 LAB — CBC
Absolute nRBC: 0 x10 3/uL (ref <=0.00)
Hematocrit: 38.9 % (ref 34.7–43.7)
Hemoglobin: 12.7 g/dL (ref 11.4–14.8)
MCH: 27.8 pg (ref 25.1–33.5)
MCHC: 32.6 g/dL (ref 31.5–35.8)
MCV: 85.1 fL (ref 78.0–96.0)
MPV: 10.3 fL (ref 8.9–12.5)
Platelet Count: 249 x10 3/uL (ref 142–346)
RBC: 4.57 x10 6/uL (ref 3.90–5.10)
RDW: 15 % (ref 11–15)
WBC: 12.07 x10 3/uL — ABNORMAL HIGH (ref 3.10–9.50)
nRBC %: 0 /100{WBCs} (ref <=0.0)

## 2023-07-15 LAB — BASIC METABOLIC PANEL
Anion Gap: 11 (ref 5.0–15.0)
BUN: 5 mg/dL — ABNORMAL LOW (ref 7–21)
CO2: 17 meq/L (ref 17–29)
Calcium: 9.5 mg/dL (ref 8.5–10.5)
Chloride: 108 meq/L (ref 99–111)
Creatinine: 0.7 mg/dL (ref 0.4–1.0)
GFR: >60 mL/min/1.73 m2 (ref >=60.0)
Glucose: 110 mg/dL — ABNORMAL HIGH (ref 70–100)
Potassium: 4.8 meq/L (ref 3.5–5.3)
Sodium: 136 meq/L (ref 135–145)

## 2023-07-15 LAB — PHOSPHORUS: Phosphorus: 3 mg/dL (ref 2.3–4.7)

## 2023-07-15 LAB — MAGNESIUM: Magnesium: 2 mg/dL (ref 1.6–2.6)

## 2023-07-15 MED ORDER — METOCLOPRAMIDE HCL 5 MG/ML IJ SOLN
10.0000 mg | Freq: Four times a day (QID) | INTRAMUSCULAR | Status: AC
Start: 2023-07-15 — End: 2023-07-16
  Administered 2023-07-15 – 2023-07-16 (×3): 10 mg via INTRAVENOUS
  Filled 2023-07-15 (×3): qty 2

## 2023-07-15 MED ORDER — OXYCODONE HCL 5 MG/5ML PO SOLN
5.0000 mg | Freq: Four times a day (QID) | ORAL | 0 refills | Status: DC | PRN
Start: 2023-07-15 — End: 2023-07-16
  Filled 2023-07-15: qty 75, 4d supply, fill #0

## 2023-07-15 MED ORDER — ENOXAPARIN SODIUM 40 MG/0.4ML IJ SOSY
40.0000 mg | PREFILLED_SYRINGE | Freq: Two times a day (BID) | INTRAMUSCULAR | 0 refills | Status: AC
Start: 2023-07-15 — End: 2023-07-30
  Filled 2023-07-15: qty 11.2, 14d supply, fill #0

## 2023-07-15 MED ORDER — GABAPENTIN 300 MG PO CAPS
300.0000 mg | ORAL_CAPSULE | Freq: Three times a day (TID) | ORAL | 0 refills | Status: DC
Start: 2023-07-15 — End: 2023-07-28
  Filled 2023-07-15: qty 9, 3d supply, fill #0

## 2023-07-15 MED ORDER — ONDANSETRON 4 MG PO TBDP
4.0000 mg | ORAL_TABLET | Freq: Three times a day (TID) | ORAL | 0 refills | Status: AC | PRN
Start: 2023-07-15 — End: ?
  Filled 2023-07-15: qty 20, 7d supply, fill #0

## 2023-07-15 MED ORDER — ACETAMINOPHEN 160 MG/5ML PO SOLN
500.0000 mg | ORAL | 0 refills | Status: DC | PRN
Start: 2023-07-15 — End: 2023-10-21
  Filled 2023-07-15: qty 473, 4d supply, fill #0

## 2023-07-15 MED ORDER — LANSOPRAZOLE 30 MG PO CPDR
30.0000 mg | DELAYED_RELEASE_CAPSULE | Freq: Every day | ORAL | 0 refills | Status: DC
  Filled 2023-07-15: qty 90, 90d supply, fill #0
  Filled 2023-11-10: qty 30, 30d supply, fill #0

## 2023-07-15 NOTE — Plan of Care (Signed)
 Problem: Pain interferes with ability to perform ADL  Goal: Pain at adequate level as identified by patient  Outcome: Progressing  Flowsheets (Taken 07/15/2023 1126)  Pain at adequate level as identified by patient: Identify patient comfort function goal     Problem: Side Effects from Pain Analgesia  Goal: Patient will experience minimal side effects of analgesic therapy  Outcome: Progressing  Flowsheets (Taken 07/15/2023 1126)  Patient will experience minimal side effects of analgesic therapy: Monitor/assess patient's respiratory status (RR depth, effort, breath sounds)     Problem: Moderate/High Fall Risk Score >5  Goal: Patient will remain free of falls  Outcome: Progressing  Flowsheets (Taken 07/15/2023 1126)  VH Moderate Risk (6-13):   ALL REQUIRED LOW INTERVENTIONS   USE OF BED EXIT ALARM IF PATIENT IS CONFUSED OR IMPULSIVE. PLACE RESET BED ALARM SIGN ABOVE BED     Problem: Day of Surgery- Sleeve Gastrectomy Surgery  Goal: Stable vital signs and fluid balance  Outcome: Progressing  Flowsheets (Taken 07/15/2023 1126)  Stable vital signs and fluid balance:   Monitor vital signs   Apply telemetry monitor as ordered  Goal: Pain at adequate level  Outcome: Progressing  Flowsheets (Taken 07/15/2023 1126)  Pain at adequate level as identified by patient: Identify patient comfort function goal  Goal: Effective ventilation maintained  Outcome: Progressing  Flowsheets (Taken 07/15/2023 1126)  Effective ventilation maintained: Monitor/assess patient's mental status and LOC  Goal: Tissue perfusion is adequate  Outcome: Progressing  Flowsheets (Taken 07/15/2023 1126)  Tissue perfusion is adequate:   Assess and monitor skin color and temperature   VTE Prevention: administer anticoagulant(s) and/or apply anti-embolism stockings/devices as ordered  Goal: Nutritional intake is adequate  Outcome: Progressing  Flowsheets (Taken 07/15/2023 1126)  Nutritional intake is adequate:   Monitor intake and output   Assess GI status (bowel  sounds, nausea/vomiting, distention, flatus)  Goal: Mobility/activity is maintained at optimum level  Outcome: Progressing  Flowsheets (Taken 07/15/2023 1126)  Mobility/activity is maintained at optimum level: Out of bed with assistance as needed  Goal: Patient/patient care companion demonstrates understanding of surgery/treatment plan, medications, and discharge plans  Outcome: Progressing  Flowsheets (Taken 07/15/2023 1126)  Patient/patient care companion demonstrates understanding of surgery/treatment plan, medications, and discharge: Initiate/review discharge plans with patient/patient care companion     Problem: Post Op Day 1- Sleeve Gastrectomy Surgery  Goal: Stable vital signs and fluid balance  Outcome: Progressing  Flowsheets (Taken 07/15/2023 1126)  Patient has stable vital signs and fluid balance: Monitor vital signs  Goal: Pain at adequate level  Outcome: Progressing  Flowsheets (Taken 07/15/2023 1126)  Pain at adequate level as identified by patient: Identify and evaluate patient comfort function goal  Goal: Effective ventilation maintained  Outcome: Progressing  Flowsheets (Taken 07/15/2023 1126)  Effective ventilation maintained:   Monitor/assess patient's mental status and LOC   Monitor/assess O2 saturation  Goal: Tissue perfusion is adequate  Outcome: Progressing  Flowsheets (Taken 07/15/2023 1126)  Tissue perfusion is adequate:   Assess and monitor skin color and temperature   VTE Prevention: administer anticoagulant(s) and/or apply anti-embolism stockings/devices as ordered  Goal: Nutritional Intake is adequate  Outcome: Progressing  Flowsheets (Taken 07/15/2023 1126)  Nutritional intake is adequate: Advance diet as tolerated to bariatric clear liquids after UGI is completed if ordered  Goal: Mobility/activity is maintained at optimum level  Outcome: Progressing  Flowsheets (Taken 07/15/2023 1126)  Mobility/activity is maintained at optimum level: Supervised ambulation 4 times a day  Goal: Patient/patient  care companion demonstrates understanding of  surgery/treatment plan, medications, and discharge plans  Outcome: Progressing  Flowsheets (Taken 07/15/2023 1126)  Patient/patient care companion demonstrates understanding of surgery/treatment plan, medications, and discharge plans: Provide/review education on patient's medications, medication side effects, and post-operative management to patient/patient care companion     Problem: Compromised Activity/Mobility  Goal: Activity/Mobility Interventions  Outcome: Progressing  Flowsheets (Taken 07/15/2023 1126)  Activity/Mobility Interventions: Pad bony prominences, TAP Seated positioning system when OOB, Promote PMP, Reposition q 2 hrs / turn clock, Offload heels

## 2023-07-15 NOTE — Final Progress Note (DC Note for stay less than 48 (Addendum)
 [facility]  Progress Note    Date Time: 07/15/2023  Patient Name: Tonya Camacho    Subjective:     Tonya Camacho had no acute events overnight, but has significant nausea this morning. Pain is well controlled. she is tolerating a small amount of bariatric clear liquids and is ambulating and urinating without issues.     Medications:     Current Inpatient Medications with Last Dose Taken[1]    Physical Exam:     Visit Vitals  BP 138/82   Pulse 63   Temp 98.8 F (37.1 C) (Oral)   Resp 14   Ht 1.727 m (5' 8)   Wt 114.1 kg (251 lb 8 oz)   SpO2 100%   BMI 38.24 kg/m         Intake and Output Summary (Last 24 hours) at Date Time    Intake/Output Summary (Last 24 hours) at 04/09/17 0643  Last data filed at 04/09/17 0118      Intake/Output Summary (Last 24 hours) at 07/15/2023 0841  Last data filed at 07/15/2023 0737  Gross per 24 hour   Intake 2247.83 ml   Output 1200 ml   Net 1047.83 ml       General appearance - alert, well appearing, and in no distress  Chest - nonlabored respirations, equal chest rise bilaterally on room air, no accessory muscle use  Heart - normal rate, regular rhythm  Abdomen - soft, obese, non distended, incisions intact and appropriately tender with skin glue  Extremities - no pedal edema    Labs:     Results for orders placed or performed during the hospital encounter of 07/14/23 (from the past 24 hours)   Basic Metabolic Panel    Collection Time: 07/15/23  4:27 AM   Result Value    Glucose 110 (H)    BUN 5 (L)    Creatinine 0.7    Calcium 9.5    Sodium 136    Potassium 4.8    Chloride 108    CO2 17    Anion Gap 11.0    GFR >60.0   CBC without Differential    Collection Time: 07/15/23  4:27 AM   Result Value    WBC 12.07 (H)    Hemoglobin 12.7    Hematocrit 38.9    Platelet Count 249    MPV 10.3    RBC 4.57    MCV 85.1    MCH 27.8    MCHC 32.6    RDW 15    nRBC % 0.0    Absolute nRBC 0.00    Collection Time: 07/15/23  4:27 AM   Result Value    Magnesium  2.0     Collection Time: 07/15/23  4:27 AM   Result Value    Phosphorus 3.0         Assessment:     This is a 38 y.o. 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               Plan:     Continue bariatric liquid diet and encourage 3-4 oz every hour  Continue IVFs perioperatively  Pain and nausea control; adding reglan  for uncontrolled nausea  Encourage ambulation/incentive spirometer  Continue oral medications  DVT PPX: Lovenox   GI ppx: Pepcid  Discharge home once meeting all milestones, pending fluid intake, pain and nausea control    ___________________  Donnice Figures, DO  Bariatric Surgery  Fellow  [facility] [employer] [phone]    Issues with nausea yesterday. Abdominal pain controlled. Voiding and ambulating. Add reglan . Increase PO intake as tolerated. Discharge pending po intake.    Charmaine Hausen, MD  Bariatric Surgery  Cell [phone]  Office [phone]           [1]   Current Facility-Administered Medications   Medication Dose Route Frequency Last Rate Last Admin    0.9 % NaCl with KCl 20 mEq infusion   Intravenous Continuous 130 mL/hr at 07/15/23 0710 New Bag at 07/15/23 0710    acetaminophen  (TYLENOL ) 160 MG/5ML oral liquid 650 mg  650 mg Oral Q4H        albuterol  (ACCUNEB ) nebulizer solution 1.25 mg  1.25 mg Nebulization Q4H WA PRN        dextrose (GLUCOSE) 40 % oral gel 15 g of glucose  15 g of glucose Oral PRN        Or    dextrose (D10W) 10% bolus 125 mL  12.5 g Intravenous PRN        Or    dextrose 50 % bolus 12.5 g  12.5 g Intravenous PRN        Or    glucagon (rDNA) (GLUCAGEN) injection 1 mg  1 mg Intramuscular PRN        diphenhydrAMINE  (BENADRYL ) injection 12.5 mg  12.5 mg Intravenous Q6H PRN        enoxaparin (LOVENOX) syringe 40 mg  40 mg Subcutaneous Daily        famotidine (PEPCID) injection 20 mg  20 mg Intravenous Q12H SCH   20 mg at 07/14/23 2139    gabapentin (NEURONTIN) capsule 300 mg  300 mg Oral Q8H         hydrALAZINE (APRESOLINE) injection 10 mg  10 mg Intravenous Q6H PRN        HYDROmorphone (DILAUDID) injection 0.5 mg  0.5 mg Intravenous Q3H PRN   0.5 mg at 07/14/23 1248    hyoscyamine SL (LEVSIN/SL) tablet 0.125 mg  0.125 mg Sublingual Q6H PRN   0.125 mg at 07/14/23 1827    metoclopramide  (REGLAN ) injection 10 mg  10 mg Intravenous 4 times per day        ondansetron  (ZOFRAN ) injection 4 mg  4 mg Intravenous Q8H PRN   4 mg at 07/14/23 1600    oxyCODONE (ROXICODONE) 5 MG/5ML solution 5 mg  5 mg Oral Q4H PRN        prochlorperazine  (COMPAZINE ) injection 5 mg  5 mg Intravenous Q6H PRN   5 mg at 07/14/23 1213    scopolamine (TRANSDERM-SCOP) patch 1 mg/72 hrs 1 patch  1 patch Transdermal Q72H PRN   1 patch at 07/14/23 1734    simethicone (MYLICON) chewable tablet 80 mg  80 mg Oral Q6H PRN

## 2023-07-15 NOTE — Consults (Signed)
 Inpatient Bariatric Nutrition Education       Surgery Type: Laparoscopic Sleeve Gastrectomy  Surgery Date: 07/14/23    Nutrition education done by surgeon's RD pre-operatively: Yes    Reinforced nutritional guidelines: Yes    Instructed patient on adequate fluid intake with goal of drinking 64 ounces/day: Yes    Patient verbalized understanding of diet progression: Yes    Patient has home supply of protein supplements: Yes    Patient has home supply of vitamins and minerals: No  Plans to purchase after being discharged    Going Home Nutrition information reviewed with patient: Yes    Educational materials/handouts provided to patient via MyChart or email: Yes    Patient verbalized comprehension of Bariatric Nutrition guidelines to follow upon discharge: Yes    Notes: Questions answered.      Leita Sober MS RD  Bariatric Registered Dietitian  [facility]

## 2023-07-15 NOTE — Plan of Care (Addendum)
 Patient alert & oriented x 4. Main complain overnight is ongoing persistent nausea. Reglan  was added to her regimen however produced minimal effect. Vomits back up even sips of PO. Ambulated in the hallways. Voiding well.      Problem: Day of Surgery- Sleeve Gastrectomy Surgery  Goal: Stable vital signs and fluid balance  Outcome: Progressing  Flowsheets (Taken 07/15/2023 0341)  Stable vital signs and fluid balance:   Monitor vital signs   Monitor/assess O2 saturation   Monitor intake and output. Notify LIP if urine output is less than 240 mL in 8 hours   Monitor lab values. Notify LIP of abnormal results.  Goal: Pain at adequate level  Outcome: Progressing  Flowsheets (Taken 07/15/2023 0341)  Pain at adequate level as identified by patient:   Identify patient comfort function goal   Evaluate if patient comfort function goal is met  Goal: Effective ventilation maintained  Outcome: Progressing  Flowsheets (Taken 07/14/2023 1240 by Sharlee Stabs, RN)  Effective ventilation maintained:   Monitor/assess patient's mental status and LOC   Monitor/assess patient's respiratory status (RR, depth, effort, breath sounds)   Monitor/assess O2 saturation   Administer O2 therapy as ordered   Position patient for maximum ventilatory efficiency, head of bed at 30 degrees   Teach/reinforce use of incentive spirometer 10 times per hour while awake, cough and deep breath as needed   Apply CPAP as ordered during any periods of rest  Goal: Nutritional intake is adequate  Outcome: Not Progressing  Flowsheets (Taken 07/14/2023 1240 by Sharlee Stabs, RN)  Nutritional intake is adequate:   Provide sips of water. Do not use straws.   Bariatric dietician consult as indicated for next day   Administer acid-reducer meds as prescribed   Assess GI status (bowel sounds, nausea/vomiting, distention, flatus)   Monitor intake and output  Note: Patient nauseous, vomits when she even sips.  Goal: Mobility/activity is maintained at optimum  level  Outcome: Progressing  Flowsheets (Taken 07/14/2023 1240 by Sharlee Stabs, RN)  Mobility/activity is maintained at optimum level:   Out of bed to chair if indicated with assistance as needed   Out of bed with assistance as needed   Ambulate at least once per shift with assistance

## 2023-07-15 NOTE — Plan of Care (Signed)
 NURSE NOTE SUMMARY  [facility] - 5NEW ORTHOPEDICS   Patient Name: Tonya Camacho   Attending Physician: Marge Charmaine CROME, MD   Today's date:   07/15/2023 LOS: 0 days   Shift Summary:                                                                Discharge Barriers: Pain management and Diet tolerance     SNF Plan for discharge : no   Patient on Bowel Regimen: no   Last AF:empnm to surgery         A+0X:4  Vitals: WNL  Pain: Pain is manageable on current regimen Managed with scheduled tylenol  and prn levsin Patient's Stated Comfort Functional Goal: 0-No pain  Dressing: clean, dry & intact  Incision site/wound assessment: abdomen   Dressing changed: no  Ambulating: yes (Steady Gait, Gait belt)  Voiding: yes Clear Yellow urine  Tolerating diet: Bariatric   Nausea/Vomiting: yes, managed with schedule Reglan  and prn zofran    Passing Gas: yes  BM: no         Bariatric : Patient able to tolerate 1-2 oz/hr without s/s of overfilling.      Is patient on Chew chew 2? no  Is patient on q2 Turn Team?  no       Net I&O for admission: Net IO Since Admission: 2,617.83 mL [07/15/23 2243]  Net I&O for past 24 hours:   Intake/Output Summary (Last 24 hours) at 07/15/2023 2243  Last data filed at 07/15/2023 1929  Gross per 24 hour   Intake 1570 ml   Output 400 ml   Net 1170 ml        Provider Notifications:      Rapid Response Notifications:  Mobility:      PMP Activity: Step 6 - Walks in Room (07/15/2023  9:00 PM)     Weight tracking:  Family Dynamic:   Last 3 Weights for the past 72 hrs (Last 3 readings):   Weight   07/15/23 2140 114.1 kg (251 lb 8 oz)   07/14/23 1200 114.1 kg (251 lb 8 oz)   07/14/23 0745 114.1 kg (251 lb 8 oz)             Recent Vitals Last Bowel Movement   BP: (!) 155/101 (07/15/2023  7:29 PM)  Heart Rate: (!) 58 (07/15/2023  7:29 PM)  Temp: 98.2 F (36.8 C) (07/15/2023  7:29 PM)  Resp Rate: 16 (07/15/2023  7:29 PM)  Height: 1.727 m (5' 8) (07/15/2023  9:40 PM)  Weight: 114.1 kg (251 lb 8 oz)  (07/15/2023  9:40 PM)  SpO2: 100 % (07/15/2023  7:29 PM)   No data recorded        Problem: Pain interferes with ability to perform ADL  Goal: Pain at adequate level as identified by patient  Outcome: Progressing  Flowsheets (Taken 07/15/2023 2241)  Pain at adequate level as identified by patient:   Identify patient comfort function goal   Assess for risk of opioid induced respiratory depression, including snoring/sleep apnea. Alert healthcare team of risk factors identified.   Assess pain on admission, during daily assessment and/or before any as needed intervention(s)   Reassess pain within 30-60 minutes of any procedure/intervention, per Pain Assessment, Intervention, Reassessment (AIR)  Cycle   Evaluate if patient comfort function goal is met   Evaluate patient's satisfaction with pain management progress   Offer non-pharmacological pain management interventions     Problem: Side Effects from Pain Analgesia  Goal: Patient will experience minimal side effects of analgesic therapy  Outcome: Progressing  Flowsheets (Taken 07/15/2023 2241)  Patient will experience minimal side effects of analgesic therapy:   Monitor/assess patient's respiratory status (RR depth, effort, breath sounds)   Assess for changes in cognitive function   Prevent/manage side effects per LIP orders (i.e. nausea, vomiting, pruritus, constipation, urinary retention, etc.)   Evaluate for opioid-induced sedation with appropriate assessment tool (i.e. POSS)     Problem: Moderate/High Fall Risk Score >5  Goal: Patient will remain free of falls  Outcome: Progressing  Flowsheets (Taken 07/15/2023 2100)  Moderate Risk (6-13):   MOD-Consider activation of bed alarm if appropriate   MOD-Apply bed exit alarm if patient is confused   MOD-Remain with patient during toileting   MOD-Perform dangle, stand, walk (DSW) prior to mobilization     Problem: Post Op Day 1- Sleeve Gastrectomy Surgery  Goal: Stable vital signs and fluid balance  Outcome:  Progressing  Flowsheets (Taken 07/15/2023 2241)  Patient has stable vital signs and fluid balance:   Monitor vital signs   Monitor/assess O2 saturation   Monitor intake and output. Notify LIP if urine output is less than 240 mL in 8 hours.   Monitor lab values. Notify LIP of abnormal results.  Goal: Pain at adequate level  Outcome: Progressing  Flowsheets (Taken 07/15/2023 2241)  Pain at adequate level as identified by patient:   Identify and evaluate patient comfort function goal   Administer Toradol  if prescribed   Crush oral pain medication per physician order/preference  Goal: Nutritional Intake is adequate  Outcome: Progressing  Flowsheets (Taken 07/15/2023 2241)  Nutritional intake is adequate:   Advance diet as tolerated to bariatric clear liquids after UGI is completed if ordered   Do not use straws   All oral meds are crushed for administration, as ordered   Assess GI status (bowel sounds, nausea/vomiting, distention, flatus)   Bariatric dietician consult as indicated if not already done  Goal: Mobility/activity is maintained at optimum level  Outcome: Progressing  Flowsheets (Taken 07/15/2023 2241)  Mobility/activity is maintained at optimum level: Supervised ambulation 4 times a day

## 2023-07-15 NOTE — Nursing Progress Note (Signed)
 A+0X4  Vitals: WNL  Pain: Pain is well controlled.. Being managed with tylenol  PO  Patient's Stated Comfort Functional Goal: 0-No pain  Incision site: 5 lap sites  Ambulating: yes (Steady Gait, standby)   Voiding: yes Clear Yellow urine  Passing Gas: yes  BM: no     Bariatric:  Patient able to tolerate minimal 0.5 oz/hr without s/s of overfilling. (Becomes nauseated)     GI:  Patient unable to tolerate clears diet. Patient had constant nausea.         Net I&O for admission: Net IO Since Admission: 1,137.83 mL [07/15/23 1922]  Net I&O for past 24 hours:   Intake/Output Summary (Last 24 hours) at 07/15/2023 1922  Last data filed at 07/15/2023 9262  Gross per 24 hour   Intake 90 ml   Output 600 ml   Net -510 ml        PLAN:  - Monitor for vomiting/electrolyte imbalance and provide antiemetic medications  - monitor vitals  - ambulate

## 2023-07-15 NOTE — Progress Notes (Signed)
 Anesthesia Post-Op Check    Pt interviewed and denies any anesthestic complications   except for nausea controlled by antiemetics. Scopolamine patch noted. Voiding without difficulty.

## 2023-07-16 ENCOUNTER — Other Ambulatory Visit: Payer: Self-pay

## 2023-07-16 LAB — BASIC METABOLIC PANEL
Anion Gap: 10 (ref 5.0–15.0)
BUN: 4 mg/dL — ABNORMAL LOW (ref 7–21)
CO2: 17 meq/L (ref 17–29)
Calcium: 9.2 mg/dL (ref 8.5–10.5)
Chloride: 110 meq/L (ref 99–111)
Creatinine: 0.7 mg/dL (ref 0.4–1.0)
GFR: >60 mL/min/1.73 m2 (ref >=60.0)
Glucose: 82 mg/dL (ref 70–100)
Potassium: 4.7 meq/L (ref 3.5–5.3)
Sodium: 137 meq/L (ref 135–145)

## 2023-07-16 MED ORDER — HYDROMORPHONE HCL 2 MG PO TABS
2.0000 mg | ORAL_TABLET | ORAL | 0 refills | Status: DC | PRN
Start: 2023-07-16 — End: 2023-11-18
  Filled 2023-07-16: qty 15, 3d supply, fill #0

## 2023-07-16 MED ORDER — METOCLOPRAMIDE HCL 5 MG PO TABS
5.0000 mg | ORAL_TABLET | Freq: Four times a day (QID) | ORAL | 0 refills | Status: DC
Start: 2023-07-16 — End: 2023-07-28
  Filled 2023-07-16: qty 15, 4d supply, fill #0

## 2023-07-16 MED ORDER — HYDROMORPHONE HCL 2 MG PO TABS
2.0000 mg | ORAL_TABLET | ORAL | Status: DC | PRN
Start: 2023-07-16 — End: 2023-07-16

## 2023-07-16 MED ORDER — METOCLOPRAMIDE HCL 5 MG/ML IJ SOLN
10.0000 mg | Freq: Four times a day (QID) | INTRAMUSCULAR | Status: AC
Start: 2023-07-16 — End: 2023-07-16
  Administered 2023-07-16 (×3): 10 mg via INTRAVENOUS
  Filled 2023-07-16 (×3): qty 2

## 2023-07-16 NOTE — Plan of Care (Signed)
 Problem: Pain interferes with ability to perform ADL  Goal: Pain at adequate level as identified by patient  07/16/2023 1927 by Editha Hila, RN  Outcome: Adequate for Discharge  07/16/2023 0744 by Editha Hila, RN  Outcome: Progressing  Flowsheets (Taken 07/16/2023 0741)  Pain at adequate level as identified by patient: Identify patient comfort function goal     Problem: Side Effects from Pain Analgesia  Goal: Patient will experience minimal side effects of analgesic therapy  07/16/2023 1927 by Editha Hila, RN  Outcome: Adequate for Discharge  07/16/2023 0744 by Editha Hila, RN  Outcome: Progressing  Flowsheets (Taken 07/16/2023 [phone])  Patient will experience minimal side effects of analgesic therapy: Monitor/assess patient's respiratory status (RR depth, effort, breath sounds)     Problem: Moderate/High Fall Risk Score >5  Goal: Patient will remain free of falls  07/16/2023 1927 by Editha Hila, RN  Outcome: Adequate for Discharge  07/16/2023 0744 by Editha Hila, RN  Outcome: Progressing  Flowsheets (Taken 07/16/2023 0739)  Moderate Risk (6-13): MOD-Apply bed exit alarm if patient is confused     Problem: Day of Surgery- Sleeve Gastrectomy Surgery  Goal: Stable vital signs and fluid balance  07/16/2023 1927 by Editha Hila, RN  Outcome: Adequate for Discharge  07/16/2023 0744 by Editha Hila, RN  Outcome: Progressing  Goal: Pain at adequate level  07/16/2023 1927 by Editha Hila, RN  Outcome: Adequate for Discharge  07/16/2023 0744 by Editha Hila, RN  Outcome: Progressing  Goal: Effective ventilation maintained  07/16/2023 1927 by Editha Hila, RN  Outcome: Adequate for Discharge  07/16/2023 0744 by Editha Hila, RN  Outcome: Progressing  Goal: Tissue perfusion is adequate  07/16/2023 1927 by Editha Hila, RN  Outcome: Adequate for Discharge  07/16/2023 0744 by Editha Hila, RN  Outcome: Progressing  Goal: Nutritional intake is  adequate  07/16/2023 1927 by Editha Hila, RN  Outcome: Adequate for Discharge  07/16/2023 0744 by Editha Hila, RN  Outcome: Progressing  Goal: Mobility/activity is maintained at optimum level  07/16/2023 1927 by Editha Hila, RN  Outcome: Adequate for Discharge  07/16/2023 0744 by Editha Hila, RN  Outcome: Progressing  Goal: Patient/patient care companion demonstrates understanding of surgery/treatment plan, medications, and discharge plans  07/16/2023 1927 by Editha Hila, RN  Outcome: Adequate for Discharge  07/16/2023 0744 by Editha Hila, RN  Outcome: Progressing     Problem: Post Op Day 1- Sleeve Gastrectomy Surgery  Goal: Stable vital signs and fluid balance  07/16/2023 1927 by Editha Hila, RN  Outcome: Adequate for Discharge  07/16/2023 0744 by Editha Hila, RN  Outcome: Progressing  Goal: Pain at adequate level  07/16/2023 1927 by Editha Hila, RN  Outcome: Adequate for Discharge  07/16/2023 0744 by Editha Hila, RN  Outcome: Progressing  Goal: Effective ventilation maintained  07/16/2023 1927 by Editha Hila, RN  Outcome: Adequate for Discharge  07/16/2023 0744 by Editha Hila, RN  Outcome: Progressing  Goal: Tissue perfusion is adequate  07/16/2023 1927 by Editha Hila, RN  Outcome: Adequate for Discharge  07/16/2023 0744 by Editha Hila, RN  Outcome: Progressing  Goal: Nutritional Intake is adequate  07/16/2023 1927 by Editha Hila, RN  Outcome: Adequate for Discharge  07/16/2023 0744 by Editha Hila, RN  Outcome: Progressing  Goal: Mobility/activity is maintained at optimum level  07/16/2023 1927 by Editha Hila, RN  Outcome: Adequate for Discharge  07/16/2023 0744 by Editha Hila, RN  Outcome: Progressing  Goal: Patient/patient care companion demonstrates understanding of surgery/treatment plan, medications, and discharge plans  07/16/2023 1927 by Editha Hila, RN  Outcome: Adequate for  Discharge  07/16/2023 [phone] by Editha Hila, RN  Outcome: Progressing     Problem: Compromised Activity/Mobility  Goal: Activity/Mobility Interventions  07/16/2023 1927 by Editha Hila, RN  Outcome: Adequate for Discharge  07/16/2023 0744 by Editha Hila, RN  Outcome: Progressing  Flowsheets (Taken 07/15/2023 1126)  Activity/Mobility Interventions: Pad bony prominences, TAP Seated positioning system when OOB, Promote PMP, Reposition q 2 hrs / turn clock, Offload heels

## 2023-07-16 NOTE — Nursing Progress Note (Signed)
 NURSE NOTE SUMMARY  [facility] - 5NEW ORTHOPEDICS   Patient Name: Tonya Camacho   Attending Physician: Marge Charmaine CROME, MD   Today's date:   07/16/2023 LOS: 1 days   Shift Summary:                                                                Discharge Barriers: Diet tolerance  Patient on Bowel Regimen: yes   Last BM: 5/27      A+0X: 4  Vitals: WNL  Pain: Pain is well controlled. Managed with tylenol  Patient's Stated Comfort Functional Goal: 0-No pain  Dressing: clean, dry & intact  Incision site/wound assessment: clean dry intact, open to air, pink, approximated  Dressing changed: no - open to air  Ambulating: yes; Steady Gait, standby  Voiding: yes Clear Yellow urine  Tolerating diet: Bariatric   Nausea/Vomiting: yes  Passing Gas: yes  BM: no   Drain: n/a  Foley: n/a    Bariatric : Patient able to tolerate 2 oz/hr without s/s of overfilling.     Is patient on Chew chew 2? no    Is patient on q2 Turn Team?  no       Net I&O for admission: Net IO Since Admission: 2,093.83 mL [07/16/23 1728]  Net I&O for past 24 hours:   Intake/Output Summary (Last 24 hours) at 07/16/2023 1728  Last data filed at 07/16/2023 1550  Gross per 24 hour   Intake 3026 ml   Output 2100 ml   Net 926 ml        Provider Notifications:      Rapid Response Notifications:  Mobility:      PMP Activity: Step 7 - Walks out of Room (07/16/2023 11:27 AM)     Weight tracking:  Family Dynamic:   Last 3 Weights for the past 72 hrs (Last 3 readings):   Weight   07/15/23 2140 114.1 kg (251 lb 8 oz)   07/14/23 1200 114.1 kg (251 lb 8 oz)   07/14/23 0745 114.1 kg (251 lb 8 oz)    No special circumstances.         Recent Vitals Last Bowel Movement   BP: (!) 144/99 (07/16/2023  3:50 PM)  Heart Rate: (!) 58 (07/16/2023  3:50 PM)  Temp: 98.2 F (36.8 C) (07/16/2023  3:50 PM)  Resp Rate: 15 (07/16/2023  3:50 PM)  Height: 1.727 m (5' 8) (07/15/2023  9:40 PM)  Weight: 114.1 kg (251 lb 8 oz) (07/15/2023  9:40 PM)  SpO2: 100 % (07/16/2023  3:50 PM)    No data recorded

## 2023-07-16 NOTE — Plan of Care (Signed)
 Problem: Pain interferes with ability to perform ADL  Goal: Pain at adequate level as identified by patient  Outcome: Progressing  Flowsheets (Taken 07/16/2023 0741)  Pain at adequate level as identified by patient: Identify patient comfort function goal     Problem: Side Effects from Pain Analgesia  Goal: Patient will experience minimal side effects of analgesic therapy  Outcome: Progressing  Flowsheets (Taken 07/16/2023 0741)  Patient will experience minimal side effects of analgesic therapy: Monitor/assess patient's respiratory status (RR depth, effort, breath sounds)     Problem: Moderate/High Fall Risk Score >5  Goal: Patient will remain free of falls  Outcome: Progressing  Flowsheets (Taken 07/16/2023 0739)  Moderate Risk (6-13): MOD-Apply bed exit alarm if patient is confused     Problem: Day of Surgery- Sleeve Gastrectomy Surgery  Goal: Stable vital signs and fluid balance  Outcome: Progressing  Goal: Pain at adequate level  Outcome: Progressing  Goal: Effective ventilation maintained  Outcome: Progressing  Goal: Tissue perfusion is adequate  Outcome: Progressing  Goal: Nutritional intake is adequate  Outcome: Progressing  Goal: Mobility/activity is maintained at optimum level  Outcome: Progressing  Goal: Patient/patient care companion demonstrates understanding of surgery/treatment plan, medications, and discharge plans  Outcome: Progressing     Problem: Post Op Day 1- Sleeve Gastrectomy Surgery  Goal: Stable vital signs and fluid balance  Outcome: Progressing  Goal: Pain at adequate level  Outcome: Progressing  Goal: Effective ventilation maintained  Outcome: Progressing  Goal: Tissue perfusion is adequate  Outcome: Progressing  Goal: Nutritional Intake is adequate  Outcome: Progressing  Goal: Mobility/activity is maintained at optimum level  Outcome: Progressing  Goal: Patient/patient care companion demonstrates understanding of surgery/treatment plan, medications, and discharge plans  Outcome:  Progressing     Problem: Compromised Activity/Mobility  Goal: Activity/Mobility Interventions  Outcome: Progressing  Flowsheets (Taken 07/15/2023 1126)  Activity/Mobility Interventions: Pad bony prominences, TAP Seated positioning system when OOB, Promote PMP, Reposition q 2 hrs / turn clock, Offload heels

## 2023-07-16 NOTE — Final Progress Note (DC Note for stay less than 48 (Signed)
 [facility]  Progress Note    Date Time: 07/16/2023  Patient Name: Tonya Camacho    Subjective:     Tonya Camacho had no acute events overnight and feels improved this morning, but continues to have some vomiting after drinking 1-2 ounces of liquids. Is voiding and ambulating without issues.    Medications:     Current Inpatient Medications with Last Dose Taken[1]    Physical Exam:     Visit Vitals  BP 133/87   Pulse 99   Temp 98.2 F (36.8 C) (Oral)   Resp 15   Ht 1.727 m (5' 8)   Wt 114.1 kg (251 lb 8 oz)   SpO2 100%   BMI 38.24 kg/m         Intake and Output Summary (Last 24 hours) at Date Time    Intake/Output Summary (Last 24 hours) at 04/09/17 9356  Last data filed at 04/09/17 0118      Intake/Output Summary (Last 24 hours) at 07/16/2023 0726  Last data filed at 07/16/2023 0442  Gross per 24 hour   Intake 3011 ml   Output 1700 ml   Net 1311 ml       General appearance - alert, well appearing, and in no distress  Chest - nonlabored respirations, equal chest rise bilaterally on room air, no accessory muscle use  Heart - normal rate, regular rhythm  Abdomen - soft, obese, non distended, incisions intact and appropriately tender with skin glue  Extremities - no pedal edema    Labs:     Results for orders placed or performed during the hospital encounter of 07/14/23 (from the past 24 hours)   Basic Metabolic Panel    Collection Time: 07/16/23  4:23 AM   Result Value    Glucose 82    BUN 4 (L)    Creatinine 0.7    Calcium 9.2    Sodium 137    Potassium 4.7    Chloride 110    CO2 17    Anion Gap 10.0    GFR >60.0         Assessment:     This is a 38 y.o. 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               Plan:     Continue bariatric liquid diet and encourage 3-4 oz every hour  Continue IVFs perioperatively  Pain and nausea control; continue reglan , change to PO dilaudid  Encourage ambulation/incentive  spirometer  Continue oral medications  DVT PPX: Lovenox   GI ppx: Pepcid  Discharge home once meeting all milestones, pending fluid intake, pain and nausea control    ___________________  Tonya Figures, DO  Bariatric Surgery Fellow  [facility]  Office [phone]           [1]   Current Facility-Administered Medications   Medication Dose Route Frequency Last Rate Last Admin    0.9 % NaCl with KCl 20 mEq infusion   Intravenous Continuous 130 mL/hr at 07/16/23 0442 New Bag at 07/16/23 0442    acetaminophen  (TYLENOL ) 160 MG/5ML oral liquid 650 mg  650 mg Oral Q4H   650 mg at 07/16/23 0428    albuterol  (ACCUNEB ) nebulizer solution 1.25 mg  1.25 mg Nebulization Q4H WA PRN        dextrose (GLUCOSE) 40 % oral gel 15 g of glucose  15 g of glucose Oral PRN  Or    dextrose (D10W) 10% bolus 125 mL  12.5 g Intravenous PRN        Or    dextrose 50 % bolus 12.5 g  12.5 g Intravenous PRN        Or    glucagon (rDNA) (GLUCAGEN) injection 1 mg  1 mg Intramuscular PRN        diphenhydrAMINE  (BENADRYL ) injection 12.5 mg  12.5 mg Intravenous Q6H PRN        enoxaparin (LOVENOX) syringe 40 mg  40 mg Subcutaneous Daily   40 mg at 07/15/23 0934    famotidine (PEPCID) injection 20 mg  20 mg Intravenous Q12H SCH   20 mg at 07/15/23 2116    gabapentin (NEURONTIN) capsule 300 mg  300 mg Oral Q8H   300 mg at 07/16/23 0428    hydrALAZINE (APRESOLINE) injection 10 mg  10 mg Intravenous Q6H PRN        HYDROmorphone (DILAUDID) injection 0.5 mg  0.5 mg Intravenous Q3H PRN   0.5 mg at 07/14/23 1248    HYDROmorphone (DILAUDID) tablet 2 mg  2 mg Oral Q3H PRN        hyoscyamine SL (LEVSIN/SL) tablet 0.125 mg  0.125 mg Sublingual Q6H PRN   0.125 mg at 07/16/23 0435    metoclopramide  (REGLAN ) injection 10 mg  10 mg Intravenous 4 times per day        ondansetron  (ZOFRAN ) injection 4 mg  4 mg Intravenous Q8H PRN   4 mg at 07/16/23 0435    prochlorperazine  (COMPAZINE ) injection 5 mg  5 mg Intravenous Q6H PRN   5 mg at 07/14/23 1213     scopolamine (TRANSDERM-SCOP) patch 1 mg/72 hrs 1 patch  1 patch Transdermal Q72H PRN   1 patch at 07/14/23 1734    simethicone (MYLICON) chewable tablet 80 mg  80 mg Oral Q6H PRN

## 2023-07-16 NOTE — Progress Notes (Signed)
 Patient discharge to home with family via Private Vehicle - Family/Friend to drive patient.   Discharge instructions given to patient and mother. Patient and family verbalized understanding of discharge instructions.  IV removed, catheter intact.  Special discharge instructions: Incision care and Lovenox Teaching  Prescriptions: Delivered to patient room  Patient Belongings: given to patient  Supplies given to patient: 1oz measuring cups, My fluid log tracking sheets, and Alcohol prep pads  Patient own medications returned: N/A   Patient given time to remove scop patch and given instructions for safe removal. Verbalized understanding

## 2023-07-17 NOTE — UM Notes (Signed)
 Requesting final auth to be faxed to [phone] or call [phone]        Auth Number :  [id number]    Discharge Date -  07/16/2023  8:24 PM    IP ADMIT DATE: 07/15/23         Medication List        PAUSE taking these medications      Magnesium  400 MG Tabs  Wait to take this until: July 29, 2023  Take 1 tablet (400 mg) by mouth daily  What changed: when to take this     vitamin D  (ergocalciferol ) 50000 UNIT Caps  Wait to take this until: July 29, 2023  Commonly known as: DRISDOL   Take 1 capsule (50,000 Units) by mouth once a week  What changed: when to take this            START taking these medications      acetaminophen  160 MG/5ML solution  Commonly known as: TYLENOL   Take 15.6 mLs (500 mg) by mouth every 4 (four) hours as needed for Fever or Pain     enoxaparin 40 MG/0.4ML syringe  Commonly known as: LOVENOX  Inject 0.4 mLs (40 mg) into the skin 2 (two) times daily for 14 days     gabapentin 300 MG capsule  Commonly known as: NEURONTIN  Take 1 capsule (300 mg) by mouth 3 (three) times daily for 3 days     HYDROmorphone 2 MG tablet  Commonly known as: DILAUDID  Take 1 tablet (2 mg) by mouth every 4 (four) hours as needed for Pain (Severe Pain)     lansoprazole 30 MG capsule  Commonly known as: PREVACID  Take 1 capsule (30 mg) by mouth once daily     metoclopramide  5 MG tablet  Commonly known as: REGLAN   Take 1 tablet (5 mg) by mouth 4 (four) times daily            CHANGE how you take these medications      * ondansetron  4 MG disintegrating tablet  Commonly known as: ZOFRAN -ODT  Take 1 tablet (4 mg) by mouth every 6 (six) hours as needed for Nausea  What changed: Another medication with the same name was added. Make sure you understand how and when to take each.     * ondansetron  4 MG disintegrating tablet  Commonly known as: ZOFRAN -ODT  Dissolve 1 tablet (4 mg) in the mouth every 8 (eight) hours as needed for Nausea  What changed: You were already taking a medication with the same name, and this prescription  was added. Make sure you understand how and when to take each.           * This list has 2 medication(s) that are the same as other medications prescribed for you. Read the directions carefully, and ask your doctor or other care provider to review them with you.                CONTINUE taking these medications      albuterol  sulfate HFA 108 (90 Base) MCG/ACT inhaler  Commonly known as: PROVENTIL      rizatriptan  10 MG tablet  Commonly known as: MAXALT   Take 1 tablet (10 mg) by mouth as needed for Migraine May repeat one additional dose in 2 hours if needed     valACYclovir  HCL 500 MG tablet  Commonly known as: VALTREX   Take 1 tablet (500 mg) by mouth 2 (two) times daily  Where to Get Your Medications        These medications were sent to [facility] at [facility]  [address], [place] [address]      Hours: M-F 9AM-6PM, Sa 9AM-3PM, Su CLOSED Phone: [phone]   acetaminophen  160 MG/5ML solution  enoxaparin 40 MG/0.4ML syringe  gabapentin 300 MG capsule  HYDROmorphone 2 MG tablet  lansoprazole 30 MG capsule  metoclopramide  5 MG tablet  ondansetron  4 MG disintegrating tablet             NAME: Tonya Camacho             MR#: [id number]      Patient Address:  [address]  [place] [address]    Patient phone: [phone] (home)     PATIENT NAME: Tonya Camacho, Tonya Camacho  DOB: 04-25-1985   PMH:  has a past medical history of Anxiety, Depression, Environmental allergies, Gastroesophageal reflux disease, Headache, Hyperlipidemia, Influenza A (03/2023), Morbid obesity with BMI of 40.0-44.9, adult (CMS/HCC), Pneumonia, Seasonal allergic rhinitis, Sleep apnea, and STD (sexually transmitted disease).  PSH:  has a past surgical history that includes TONSILLECTOMY (1998); EXCISION CYST/LIPOMA; ROBOT XI ASSISTED,LAPAROSCOPIC,SLEEVE GASTRECTOMY (N/A, 07/14/2023); and LAPAROSCOPIC, OMENTOPEXY (N/A, 07/14/2023).      DIAGNOSIS:     ICD-10-CM    1. Morbid obesity (CMS/HCC)   E66.01 Surgical Pathology     Surgical Pathology          Information sent by a UR Department Case Management Assistant

## 2023-07-20 ENCOUNTER — Encounter (INDEPENDENT_AMBULATORY_CARE_PROVIDER_SITE_OTHER): Payer: Self-pay | Admitting: Surgery

## 2023-07-20 LAB — SURGICAL PATHOLOGY

## 2023-07-27 NOTE — Progress Notes (Signed)
 S:   Tonya Camacho presents for f/u after SG surgery. Pt is 15 days post-op.  Pt states tolerating clear liquid diet poorly.    GI: everything is tasting too sweet or too thick and heavy.  Vomiting everything but water. Chronically nauseated, taking zofran  ATC.    Currently, pt is able to consume about 16 oz clear fluids daily.  Fluids include: water. Does best when it is very cold.  She is sipping fluids during visit.    Supplemental protein: Pt  reports consuming 0 grams protein daily from a supplemental source. She has tried Protein2O and BA since surgery. She has done well w/Fair Life shakes prior to surgery and will try these again. She also has Premier Protein, and will also try Ensure Max again.    O:    Surgery Date: 07/14/23  Today's Wt:     Previous Wts:    Wt Readings from Last 10 Encounters:   07/15/23 251 lb 8 oz   07/08/23 261 lb 3.2 oz   06/30/23 264 lb   06/17/23 264 lb   05/18/23 258 lb   05/13/23 258 lb   04/29/23 262 lb 6.4 oz   04/27/23 261 lb   04/21/23 259 lb   04/14/23 265 lb 12.8 oz    BMI:  There is no height or weight on file to calculate BMI.    A:  Pt appears to be getting less than adequate hydration and less than adequate protein as evidenced by diet recall.  Pt verbalizes comprehension and desired compliance with next diet stage.  Pt was provided w/instructions detailing diet advancement. Also discussed vitamins.  Discussed w/NP. Pt will try diluting or clear protein options, unflavored protein, work to increase fluid intake. Discussed [facility] for fluids if needed. She will call if no improvement or worsening symptoms.    P:  1.  Advance diet to full liquids today and then to soft/mushy diet on 08/04/23.  Diet guidelines reviewed and pt verbalized understanding and desired compliance.  2.  Start vitamin/mineral supplementation with full liquid diet.   3. F/u in 6 weeks for diet advance to solids.      Spent a total of 15 minutes educating pt in an individual one-on-one  setting.

## 2023-07-28 ENCOUNTER — Encounter (INDEPENDENT_AMBULATORY_CARE_PROVIDER_SITE_OTHER): Payer: Self-pay | Admitting: Family

## 2023-07-28 ENCOUNTER — Ambulatory Visit (INDEPENDENT_AMBULATORY_CARE_PROVIDER_SITE_OTHER): Admitting: Family

## 2023-07-28 ENCOUNTER — Ambulatory Visit (INDEPENDENT_AMBULATORY_CARE_PROVIDER_SITE_OTHER): Admitting: Registered"

## 2023-07-28 VITALS — BP 107/79 | HR 99 | Ht 68.0 in | Wt 236.0 lb

## 2023-07-28 DIAGNOSIS — Z713 Dietary counseling and surveillance: Secondary | ICD-10-CM

## 2023-07-28 DIAGNOSIS — Z1321 Encounter for screening for nutritional disorder: Secondary | ICD-10-CM

## 2023-07-28 DIAGNOSIS — Z9884 Bariatric surgery status: Secondary | ICD-10-CM

## 2023-07-28 DIAGNOSIS — Z903 Acquired absence of stomach [part of]: Secondary | ICD-10-CM

## 2023-07-28 MED ORDER — PROMETHAZINE HCL 6.25 MG/5ML PO SOLN
12.5000 mg | Freq: Four times a day (QID) | ORAL | 3 refills | Status: AC | PRN
Start: 2023-07-28 — End: ?

## 2023-07-28 MED ORDER — FAMOTIDINE 20 MG PO TABS
20.0000 mg | ORAL_TABLET | Freq: Every day | ORAL | 5 refills | Status: DC
Start: 2023-07-28 — End: 2023-12-14

## 2023-07-28 NOTE — Patient Instructions (Signed)
 Advancing Your Diet    Your full liquid stage will be from:  _6/10/25___ to __6/16/25__    Advancing to full liquids:  Full liquids, the 3rd week of the post-op liquid phase, includes:  low-fat/fat-free milk (or unsweetened dairy substitutes like almond milk or soy milk), low sugar yogurt (w/out fruit pieces or seeds), sugar free pudding, V8 vegetable juice, soups that have been pureed and strained (thinned down to pourable consistency), and runny Cream of Wheat.    For any full liquid items, measure 2 ounces (1/4 cup) per serving.  Aim to drink 64 ounces of clear, sugar-free fluids daily, and continue to drink your protein shakes. You need to get 50-60 grams of protein from your protein shakes daily.         The mushy phase will begin on:  __6/17/25__    Advancing to the mushy phase:  Generally, after one week of full liquids, you may advance to the mushy phase of the diet.  Mushies will last 6 additional weeks after your liquid diet.  This is to allow proper healing of your stomach. Make sure to stay on mushies for the entire 6-week time period.      When advancing to mushies remember:  Eat 3 meals daily - breakfast, lunch and dinner. Try not to skip meals!  You may have to eat on a schedule to help you remember to eat.  Follow the 30/30 rule.  No drinking and eating at the same time.  Mushies are not necessarily blended or pureed food.  Think soft, moist, and tender foods. The food should be soft enough that you can easily cut or mash it with a fork.  While on the mushy phase, definitely DO NOT consume: anything with seeds (e.g., berries), raw or hard vegetables (e.g., lettuce), hard fruits (e.g., apples). Also do not eat citrus fruits, nuts/peanuts, corn, or tough cuts of meat like beef and pork.  These can irritate your new stomach and make you sick!  Avoid bread/rice/pasta for the FIRST YEAR after your surgery. Your dietitian will review when and how to reintroduce these foods.  Measure your portions.  No  guessing or eyeballing. Each meal should be 2 ounces or  cup for the entire meal.    Each meal should be half protein (one ounce) and half produce (one ounce of vegetable and/or fruit).  Note:  one large egg is 2 ounces and can be eaten as an entire meal.    Measure your food after you have cooked/prepared it.  Take it slow.  Chew well.  You will be learning new feelings and sensations that tell you "I'm satisfied."  Aim to stop eating when you're satisfied and no longer hungry.  Don't wait until you feel full or stuffed to stop.  Review your Nutrition After Weight Loss Surgery book for the list of foods that are OK to eat during the mushy phase, sample meals, and the foods to avoid list.  Use your two protein shakes as "snacks" and continue to consume 50-60 grams of protein daily from your shakes.  Remember, you need these shakes PLUS the protein from your meals.  Your total protein intake may be upwards of 70-90 grams daily.    NO caffeine (for two months after surgery).  NO carbonated/fizzy drinks.  NO alcohol.      Your next appointment will be in six weeks with the dietitian.  If you've tolerated the mushy phase well, the dietitian will advance you to solid  foods.    You will continue to have regular follow ups with the team (NP or physician, RD, behavioral health). These visits are important to assure you adjusting to your new lifestyle and staying healthy.  Generally you'll have a follow up every three  months in the first year, and every six months in the second year.  By the third year, you'll have annual follow ups.  Note that the frequency of follow ups will be communicated by our team and are subject to change depending on your unique needs!

## 2023-07-28 NOTE — Progress Notes (Signed)
 Post-Operative Progress Note    Patient Name: Tonya Camacho, Tonya Camacho  Age: 38 y.o.  Sex: female   DOB: 13-Nov-1985  MRN: [id number]    Subjective:     Tonya Camacho returns for postop follow up 14 days after Robotic Assisted Laparoscopic Sleeve Gastrectomy.   She feels well.   She denies fever, chills, or cough.   She has no nausea, vomiting, reflux, or abdominal pain.  She denies calf swelling or leg pain.  She has been tolerating liquids without problems.   She is drinking 16 oz of fluids and getting 0 grams of protein from supplements daily. Able to tolerate plain water but nausea and vomiting with everything else.  SF fluids are too sweet  She is tolerating increased activity and walking regularly.   Pt says surgical incisions are CDI    Pt has concerns re: nausea    Pathology - Stomach, partial gastrectomy:  -Portion of stomach without significant pathologic alteration.  -Negative for histologic features of Helicobacter pylori.  -Negative for intestinal metaplasia or dysplasia       Outpatient Information:    Total Opioids Used (Outpatient) 15 tabs Morphine (MS04) Equivalent (Outpatient)  mg   Outpatient Pain Score  0 Unused Opioids Returned no   Additional Opioids Provided  no 30-day Post-operative Opioid Use yes         Past Medical History:     Past Medical History:   Diagnosis Date    Anxiety     hx of-no med currently    Depression     hx of-no med currently    Environmental allergies     Gastroesophageal reflux disease     no longer has GERD per recent EGD    Headache     Migraines    Hyperlipidemia     no med    Influenza A 03/2023    Morbid obesity with BMI of 40.0-44.9, adult (CMS/HCC)     Pneumonia     as a teenager    Seasonal allergic rhinitis     Sleep apnea     does not use cpap    STD (sexually transmitted disease)     Genital Herpes       Past Surgical History:   Past Surgical History[1]    Family History:   Family History[2]    Allergies:   Allergies[3]    Medications:     Prior to  Admission medications   Medication Sig Start Date End Date Taking? Authorizing Provider   acetaminophen  (TYLENOL ) 160 MG/5ML solution Take 15.6 mLs (500 mg) by mouth every 4 (four) hours as needed for Fever or Pain 07/15/23   Linnell Donnice MATSU, DO   albuterol  sulfate HFA (PROVENTIL ) 108 (90 Base) MCG/ACT inhaler Inhale 2 puffs into the lungs every 4 (four) hours as needed for Wheezing    [provider]   enoxaparin (LOVENOX) 40 MG/0.4ML syringe Inject 0.4 mLs (40 mg) into the skin 2 (two) times daily for 14 days 07/15/23 07/30/23  Linnell Donnice MATSU, DO   gabapentin (NEURONTIN) 300 MG capsule Take 1 capsule (300 mg) by mouth 3 (three) times daily for 3 days 07/15/23 07/19/23  Linnell Donnice MATSU, DO   HYDROmorphone (DILAUDID) 2 MG tablet Take 1 tablet (2 mg) by mouth every 4 (four) hours as needed for Pain (Severe Pain) 07/16/23   Linnell Donnice MATSU, DO   lansoprazole (PREVACID) 30 MG capsule Take 1 capsule (30 mg) by mouth once daily 07/15/23 01/11/24  Nier,  Donnice MATSU, DO   [Paused] Magnesium  400 MG Tab Take 1 tablet (400 mg) by mouth daily  Patient taking differently: Take 1 tablet (400 mg) by mouth once every morning  Wait to take this until: July 29, 2023 04/07/23   Darrick Dubois, MD   metoclopramide  (REGLAN ) 5 MG tablet Take 1 tablet (5 mg) by mouth 4 (four) times daily 07/16/23   Linnell Donnice MATSU, DO   ondansetron  (ZOFRAN -ODT) 4 MG disintegrating tablet Take 1 tablet (4 mg) by mouth every 6 (six) hours as needed for Nausea 03/24/23   Maylene Elio PARAS, MD   ondansetron  (ZOFRAN -ODT) 4 MG disintegrating tablet Dissolve 1 tablet (4 mg) in the mouth every 8 (eight) hours as needed for Nausea 07/15/23   Linnell Donnice MATSU, DO   rizatriptan  (MAXALT ) 10 MG tablet Take 1 tablet (10 mg) by mouth as needed for Migraine May repeat one additional dose in 2 hours if needed 04/07/23   Darrick Dubois, MD   valACYclovir  HCL (VALTREX ) 500 MG tablet Take 1 tablet (500 mg) by mouth 2 (two) times daily 05/11/23 05/10/24  Lawson Meade KIDD, DO    [Paused] vitamin D , ergocalciferol , (DRISDOL ) 50000 UNIT Cap Take 1 capsule (50,000 Units) by mouth once a week  Patient taking differently: Take 1 capsule (50,000 Units) by mouth Once each week on Monday  Wait to take this until: July 29, 2023 05/01/23   Lawson Meade KIDD, DO       Review of Systems:     Constitutional: negative for fevers, night sweats  Head and neck: denies blurry vision, headaches, dry mouth, or hearing impairment  Respiratory: negative for SOB, cough  Cardiovascular: negative for chest pain, palpitations  Gastrointestinal: negative for abdominal pain or bloating  Genitourinary: negative for hematuria, dysuria  Musculoskeletal: negative for bone pain, myalgias, or stiff joints  Neurological: negative for dizziness, gait problems, headaches and memory problems  Behavioral/Psych: negative for fatigue, loss of interest in favorite activities, sleep disturbance  Endocrine: negative for temperature intolerance      Physical Exam:     Vitals:    07/28/23 0800   BP: 107/79   Pulse: 99     Wt Readings from Last 10 Encounters:   07/28/23 236 lb   07/15/23 251 lb 8 oz   07/08/23 261 lb 3.2 oz   06/30/23 264 lb   06/17/23 264 lb   05/18/23 258 lb   05/13/23 258 lb   04/29/23 262 lb 6.4 oz   04/27/23 261 lb   04/21/23 259 lb     Appearance: Comfortable, no acute distress, well nourished  HEENT: Normocephalic, clear conjunctiva.   Chest: Breathing unlabored. S1, S2, LS clear.  Neuro: A&Ox3  Psych: Calm, cooperative  Abd: Soft, non-tender, no palpable/visible abnormalities. Surgical incisions CDI.      Labs Reviewed:     Lab Results   Component Value Date    WBC 12.07 (H) 07/15/2023    HGB 12.7 07/15/2023    HCT 38.9 07/15/2023    MCV 85.1 07/15/2023    PLT 249 07/15/2023     '  Chemistry        Component Value Date/Time    NA 137 07/16/2023 0423    K 4.7 07/16/2023 0423    CL 110 07/16/2023 0423    CO2 17 07/16/2023 0423    BUN 4 (L) 07/16/2023 0423    CREAT 0.7 07/16/2023 0423    GLU 82 07/16/2023 0423  Component Value Date/Time    CA 9.2 07/16/2023 0423    ALKPHOS 66 04/29/2023 0959    AST 16 04/29/2023 0959    ALT 11 04/29/2023 0959    BILITOTAL 0.3 04/29/2023 0959          Lab Results   Component Value Date    ALT 11 04/29/2023    AST 16 04/29/2023    ALKPHOS 66 04/29/2023    BILITOTAL 0.3 04/29/2023     Lab Results   Component Value Date    Vitamin D  25-OH, Total 16 (L) 04/29/2023     Lab Results   Component Value Date    B12 761 04/29/2023     No results found for: VITAMINB1  No results found for: VITAMINARETI  Cholesterol   Date Value Ref Range Status   04/29/2023 203 (H) <=199 mg/dL Final     Triglycerides   Date Value Ref Range Status   04/29/2023 71 34 - 149 mg/dL Final     HDL   Date Value Ref Range Status   04/29/2023 63 >=40 mg/dL Final     Comment:     An HDL Cholesterol 40mg /dL is low and constitutes a coronary heart disease risk factor, and an HDL Cholesterol >59mg /dL is a negative risk factor for CHD.     Reference: American Heart Association; Circulation 2004     Cholesterol / HDL Ratio   Date Value Ref Range Status   04/29/2023 3.2 Index Final     Comment:     Cholesterol/HDL Ratio:  Classification                   Female     Female  Very Low (1/2 Average Risk)      <3.4     <3.3  Low Risk                          4.0      3.8  Average Risk                      5.0      4.5  Moderate Risk(2x  Average Risk)   9.5      7.0  High Risk (3x Average Risk)      >23.0    >11.0       Lab Results   Component Value Date    TSH 2.97 04/29/2023     Lab Results   Component Value Date    CA 9.2 07/16/2023    PHOS 3.0 07/15/2023     Lab Results   Component Value Date    IRON 71 04/29/2023    TIBC 263 (L) 04/29/2023     Lab Results   Component Value Date    HGBA1C 5.2 04/17/2023           Rads:   Radiological Procedure reviewed.     No results found.  Assessment/Plan   Status post Robotic Assisted Laparoscopic Sleeve Gastrectomy x 14 days. No postoperative complications. Tolerating diet without  difficulties.     1. Advance diet per Registered Dietician. Increase fluids to a goal of 64 or more oz daily. Increase protein from supplements to a goal of 50-60 grams daily. Will progress to full liquids.  If still having n/v will consider EGD  2. Increase activity as tolerated.  No heavy lifting for a total of 4-6 weeks after surgery.  3. Medications reviewed.  Continue vitamins.  Begin ursodiol with the soft food diet if prescribed.  4. Follow up with PCP regularly.    5. Routine care in six weeks or sooner if needed.      Signed by: Fortunato JINNY Moody, FNP,          [1]   Past Surgical History:  Procedure Laterality Date    EXCISION CYST/LIPOMA      2011 and 2013    LAPAROSCOPIC, OMENTOPEXY N/A 07/14/2023    Procedure: LAPAROSCOPIC, OMENTOPEXY;  Surgeon: Marge Charmaine CROME, MD;  Location: [facility];  Service: General;  Laterality: N/A;    ROBOT XI ASSISTED,LAPAROSCOPIC,SLEEVE GASTRECTOMY N/A 07/14/2023    Procedure: ROBOT XI ASSISTED, LAPAROSCOPIC, SLEEVE GASTRECTOMY;  Surgeon: Marge Charmaine CROME, MD;  Location: [facility];  Service: General;  Laterality: N/A;    TONSILLECTOMY  1998   [2]   Family History  Problem Relation Name Age of Onset    Hypertension Mother Chandra Asher     Arthritis Mother Kazia Grisanti     Obesity Mother Javonne Louissaint     Asthma Father Jhordyn Hoopingarner     Hypertension Father Kristena Wilhelmi     Alcohol abuse Father Larry Knipp     Arthritis Father Jeslin Bazinet     Diabetes Father Ysidra Sopher     Alcohol abuse Paternal Grandfather Claudean Slocumb     Early death Brother Layman Slocumb     Kidney failure Brother Layman Slocumb     Diabetes Brother Layman Slocumb     Obesity Sister Oddis Slocumb     Obesity Sister [facility]     Dementia Maternal Grandmother Zelda Glatter     Migraines Paternal Grandmother Waddell Slocumb    [3]   Allergies  Allergen Reactions    Doxycycline Hives, Itching and Nausea And Vomiting    Penicillins Hives and Itching     Unknown [Other Drug]      Unknown eye drop - made infection worse

## 2023-08-03 ENCOUNTER — Other Ambulatory Visit (INDEPENDENT_AMBULATORY_CARE_PROVIDER_SITE_OTHER): Payer: Self-pay | Admitting: Family Medicine

## 2023-08-07 ENCOUNTER — Encounter (INDEPENDENT_AMBULATORY_CARE_PROVIDER_SITE_OTHER): Payer: Self-pay | Admitting: Family

## 2023-08-10 ENCOUNTER — Ambulatory Visit: Payer: No Typology Code available for payment source | Admitting: Neurology

## 2023-09-07 ENCOUNTER — Ambulatory Visit: Attending: Neurology | Admitting: Neurology

## 2023-09-07 ENCOUNTER — Encounter: Payer: Self-pay | Admitting: Neurology

## 2023-09-07 VITALS — BP 117/76 | HR 72 | Resp 16 | Ht 68.0 in | Wt 228.6 lb

## 2023-09-07 DIAGNOSIS — G43E09 Chronic migraine with aura, not intractable, without status migrainosus: Secondary | ICD-10-CM

## 2023-09-07 MED ORDER — BUTALBITAL-APAP-CAFFEINE 50-325-40 MG PO TABS
1.0000 | ORAL_TABLET | ORAL | 3 refills | Status: AC | PRN
Start: 2023-09-07 — End: ?

## 2023-09-07 NOTE — Progress Notes (Unsigned)
 Subjective:           Patient ID: Adonica Fukushima is a 38 y.o. female here for Follow-up (OSA (obstructive sleep apnea)/)      HPI    38 y.o. female with PMH depression/anxiety, mild OSA here for further evaluation of migraines.    Recently underwent gastric sleeve procedure May 2025.  Lost 35 lbs since surgery.    Headache hx: since 2018, worse since Sept 2024 - frequency/intensity  Location: right frontal  Described as throbbing  Occurs: 2-3/mth <-- few times/year --> monthly  Duration: 1-4 days  Intensity: 10/10 <-- 8-9/10  Aura: pounding/nausea  Associated with dizziness, diaphoresis, nausea, photo/phonophobia, tingling in right toes  Denies rhinorrhea, lacrimation, conj injection, weakness  Exacerbating factors: sneezing (not coughing, valsalva)  Alleviating factors: lays in cool area  Sleep: 9.5 hours, mild OSA on home sleepy study (not using CPAP machine)  Mood: increase stress with work  Caffeine : 2 sodas/day  OTC meds: excedrin few times/week  Current medications: Maxalt , Mg and B2, Zofran   Failed medications: Topiramate  (eye pain/strain), imitrex (mild relief)    Review of Systems All systems were reviewed and were negative except as described in the HPI.    Current/Home Medications    ACETAMINOPHEN  (TYLENOL ) 160 MG/5ML SOLUTION    Take 15.6 mLs (500 mg) by mouth every 4 (four) hours as needed for Fever or Pain    ALBUTEROL  SULFATE HFA (PROVENTIL ) 108 (90 BASE) MCG/ACT INHALER    Inhale 2 puffs into the lungs every 4 (four) hours as needed for Wheezing    B COMPLEX-BIOTIN-FA (VITAMIN B50 COMPLEX PO)    Take by mouth    BIOTIN 1 MG CAP    Take by mouth    CALCIUM CITRATE-VITAMIN D  315-5 MG-MCG TABLET    Take 500 mg by mouth    FAMOTIDINE  (PEPCID ) 20 MG TABLET    Take 1 tablet (20 mg) by mouth once daily    FERROUS SULFATE 325 (65 FE) MG TABLET    Take 36 mg by mouth every morning with breakfast    HYDROMORPHONE  (DILAUDID ) 2 MG TABLET    Take 1 tablet (2 mg) by mouth every 4 (four) hours as needed  for Pain (Severe Pain)    LANSOPRAZOLE  (PREVACID ) 30 MG CAPSULE    Take 1 capsule (30 mg) by mouth once daily    MAGNESIUM  400 MG TAB    Take 1 tablet (400 mg) by mouth daily    MULTIPLE VITAMINS-MINERALS (MULTIVITAMIN WITH MINERALS) TABLET    Take 1 tablet by mouth once daily    ONDANSETRON  (ZOFRAN -ODT) 4 MG DISINTEGRATING TABLET    Dissolve 1 tablet (4 mg) in the mouth every 8 (eight) hours as needed for Nausea    PROMETHAZINE  (PHENERGAN ) 6.25 MG/5ML SYRUP    Take 10 mLs (12.5 mg) by mouth 4 (four) times daily as needed for Nausea    RIZATRIPTAN  (MAXALT ) 10 MG TABLET    Take 1 tablet (10 mg) by mouth as needed for Migraine May repeat one additional dose in 2 hours if needed    VALACYCLOVIR  HCL (VALTREX ) 500 MG TABLET    Take 1 tablet (500 mg) by mouth 2 (two) times daily    VITAMIN B-12 (CYANOCOBALAMIN) 100 MCG TABLET    Take 0.5 tablets (50 mcg) by mouth once daily    VITAMIN D , CHOLECALCIFEROL, 10 MCG (400 UNIT) TABLET    Take 1 tablet (10 mcg) by mouth once daily    VITAMIN D , ERGOCALCIFEROL , (  DRISDOL ) 50000 UNIT CAP    Take 1 capsule (50,000 Units) by mouth once a week       The following portions of the patient's history were reviewed and updated as appropriate: allergies, current medications, past family history, past medical history, past social history, past surgical history and problem list.  No pertinent family history, except what is stated in HPI.          Objective:      Physical Exam Neurological Exam    On exam, the patient's mental status appeared normal. Speech showed appropriate content, was fluent, and the patient followed commands. Attention and concentration appeared normal. Cranial nerves showed, full extraocular movements, midline tongue, and a symmetric face. Motor exam showed good movement in all extremities without obvious focality or weakness. No pronator drift. Sensation was intact to light touch per patient.  No tremors.    Gait was stable       Imaging/Testing:  01/2022 MRI brain  wn[facility]      Assessment:         38 y.o. female with PMH of depression/anxiety, mild OSA here for further evaluation of migraines.        Plan:      - cont Magnesium  400mg  and Riboflavin  400mg  supplementation for headache prevention  - cont Maxalt  PRN  - if this fails, recommend started Ubrelvy  - provided limited Firoicet PRN  - cont Zofran  PRN  -Patient was counseled to follow the SEEDS for success in headache management, including Sleep hygiene, Exercising regularly, Eating healthy and regular meals, Drinking water , keeping a headache Diary, and Stress reduction.     Lynzee Lindquist, MD

## 2023-09-07 NOTE — Progress Notes (Unsigned)
 S:  Pt presents for f/u visit after SG surgery.  Has been on soft/mushy phase since 08/04/23, and tolerating well.    Pt reports taking the following vitamin/mineral supplements: reports taking all supplements    Pt also reports consuming 42-50+ grams protein daily from a supplemental source of [employer].    Pt reports being able to consume about 24 oz clear fluids daily.  Pt states they have begun compliance with the 30/30 rule during meal times.      Pt reports current portion sizes are:  2oz.      GI: experiencing gurgling noise when she eats too fast, or when she drinks water  or a protein shake too fast. It is not painful and she does not vomit. The gurgling resolves when she goes for a walk.  Uses 1/4c prune juice every two days for regularity.     Diet Recall:     Breakfast: scrambled egg w/cheese or boiled egg   Lunch: 01/1229 ground beef and mashed potatoes w/gravy or [place] and cheese or crab salad w/crackers or tuna, [employer] chili, salmon   Dinner: repeat of lunch   Snacks: a small spoon of yogurt for B50 and iron (due to taste)   Beverages: decaf coffee, water , zero sugar juice    Pt states currently participating in the following activities for exercise:  walking at least two miles, started back w/strength training.  Noticed coughing and stomach gurgling between sets during her strength training class on Sunday.    O:  Today 's Wt:  228lb 3.2oz   Previous Wt:   Wt Readings from Last 10 Encounters:   09/07/23 228 lb 9.6 oz   07/28/23 236 lb   07/15/23 251 lb 8 oz   07/08/23 261 lb 3.2 oz   06/30/23 264 lb   06/17/23 264 lb   05/18/23 258 lb   05/13/23 258 lb   04/29/23 262 lb 6.4 oz   04/27/23 261 lb      Total Wt Loss: 23lb BMI:  There is no height or weight on file to calculate BMI.  Allergy:  Allergies[1]    A:  Pt current wt loss is adequate.  Per diet recall, pt portion sizes at meal times are adequate and food choices are appropriate - high in lean protein and produce.  Pt is drinking less  than adequate amounts of clear fluids daily and will work to increase.  Pt is progressing well with physical activity.  Pt verbalizes comprehension and desired compliance with solid diet phase.  Will notify NP of pt concerns w/ongoing gurgling w/drinking, eating too fast, and during strength training, although pt is tolerating diet w/o difficulty  Pt provided w/educational handout detailing diet instruction.    Has the patient been admitted to a hospital other than [facility] within the first 30 days following surgery?  no  Has the patient had a re-operation or intervention performed by a health care provider other than their surgeon within the first 30 days following surgery?   no  Has the patient been treated by a health care provider other than their surgeon for any complication within the first 30 days following surgery? (including ED visits)  no  P:  1.  Pt to advance diet, as tolerated, to solids 7/272/5.  Reviewed solid diet guidelines and meal/food options reviewed.  Pt also encouraged to continue measuring all portions.  2.  Advance activity as tolerated.  3.  Pt to f/u in 4 months or PRN.  Contact information provided for pt in case of questions or concerns.    Spent a total of 15 minutes educating pt in an individual one-on-one setting.  Plan reviewed with surgeon.         [1]   Allergies  Allergen Reactions    Doxycycline Hives, Itching and Nausea And Vomiting    Penicillins Hives and Itching    Unknown [Other Drug]      Unknown eye drop - made infection worse

## 2023-09-08 ENCOUNTER — Ambulatory Visit (INDEPENDENT_AMBULATORY_CARE_PROVIDER_SITE_OTHER): Admitting: Registered"

## 2023-09-08 DIAGNOSIS — Z713 Dietary counseling and surveillance: Secondary | ICD-10-CM

## 2023-09-08 DIAGNOSIS — Z9884 Bariatric surgery status: Secondary | ICD-10-CM

## 2023-09-08 NOTE — Patient Instructions (Signed)
 Advancing to Solid Foods:  09/13/23    About 9 weeks or 2 months post-op, you will most likely be able to advance to solid foods.  Sticking to the appropriate types of foods and limiting the foods you should avoid (like alcohol, sweets or fatty foods) will help maximize your weight loss and overall success long-term.  In general, you will now be able to eat the following foods:  Raw or crunchy fruits and vegetables.  You can reintroduce items like apples, carrots, salad or cucumbers.  Dense or tougher meats and protein (like grilled chicken or steak)  Nuts and crunchy nut butters.  You can try peanuts, walnuts or crunchy peanut butter.  Corn and corn products  Items with seeds like flax seeds, chia seeds or sunflower seeds.  Consume caffeine  - like regular coffee and tea.  Start swallowing medications/vitamins whole.  You no longer have to crush these.    Tolerance will vary patient to patient.  However, the majority of patients will be able to tolerate most, if not all foods, within a few weeks.  However, take your time when advancing to any new food and be patient with yourself.    Once on solid foods, continue eating 3 meals a day in addition to your protein shakes.  It's encouraged that the first year (and generally as long as you desire further weight loss) follow this eating pattern.  Remember, you need to consume 50-65 g of protein from your shakes in addition to your 3 meals.     In addition, it's highly encouraged to continue measuring all of your portions.  This will help maximize your weight loss and prevent unnecessarily slow weight loss during the first year.  We will remind you of these increases at your office visits.  Your portions should advance as follows:    From 2-3 months:   your portions should be 2 oz total per meal or  cup total per meal  From  3-6 months:  your portions should be 3 oz total per meal 1/3 cup total per meal  From 6-9 months:  your portions should be 4 oz total per meal or   cup total per meal   From 9-12 months:  your portions should be 6 oz total per meal or  cup total per meal  From 12+ months:  your portions should stay at 8 oz total per meal or 1 cup total per meal    Lastly, in order to achieve adequate weight loss and long-term maintenance of your goal weight, you should continue to AVOID the following foods:  Alcohol (cut out for one year)  Carbonation  Sweets or sugary treats   High fat foods (fast foods or fried foods)  Grains like bread, rice, pasta, etc (cut out for one year or as long as continued weight loss is desired)    Sample Meal Plan (serving size 2 oz per meal)     Day 1: Day 2: Day 3:   Breakfast: Cottage Cheese & Cinnamon-Peach Parfait  Protein: 1 oz cottage cheese with a sprinkle of cinnamon  Produce: 1 oz finely diced fresh peaches Egg & Guacamole Scramble  Protein: 1 oz scrambled egg  Produce: 1 oz guacamole   Mini Greek Yogurt & Berry Swirl  Protein: 1 oz plain [place] yogurt with a drizzle of sugar-free vanilla extract  Produce: 1 oz smashed blackberries   Lunch: Caprese Chicken Bites  Protein: 1 oz grilled chicken breast, chopped  Produce: 1 oz  cherry tomatoes, halved, with a drizzle of balsamic glaze and fresh basil Shrimp Ceviche Cup  Protein: 1 oz diced cooked shrimp  Produce: 1 oz diced cucumber, red onion, and a squeeze of lime juice   [place] & Roasted Red Pepper Roll-Ups  Protein: 1 oz sliced roasted turkey breast  Produce: 1 oz roasted red pepper, rolled inside the [place] slice with a dab of light cream cheese   Dinner: Salmon & Avocado Mash  Protein: 1 oz flaked grilled salmon with a squeeze of lemon  Produce: 1 oz mashed avocado Tuna Stuffed Mini Bell Peppers  Protein: 1 oz tuna salad (tuna mixed with a little light mayo or [place] yogurt)  Produce: 1 oz mini bell pepper halves stuffed with the tuna mixture Garlic Shrimp & Spinach Saut  Protein: 1 oz garlic sauted shrimp  Produce: 1 oz wilted spinach     Snack: Approved protein supplement Approved  protein supplement Approved protein supplement   Clear Fluids: 64 oz 64 oz 64 oz

## 2023-09-12 ENCOUNTER — Ambulatory Visit (FREE_STANDING_LABORATORY_FACILITY): Admitting: Family Medicine

## 2023-09-12 ENCOUNTER — Encounter (INDEPENDENT_AMBULATORY_CARE_PROVIDER_SITE_OTHER): Payer: Self-pay

## 2023-09-12 VITALS — BP 132/84 | HR 84 | Temp 98.1°F | Resp 16 | Ht 68.0 in | Wt 225.4 lb

## 2023-09-12 DIAGNOSIS — Z202 Contact with and (suspected) exposure to infections with a predominantly sexual mode of transmission: Secondary | ICD-10-CM

## 2023-09-12 LAB — HEPATITIS C ANTIBODY, TOTAL: Hepatitis C Antibody: NONREACTIVE

## 2023-09-12 LAB — HEPATITIS B SURFACE (HBV) ANTIBODY, QUANTITATIVE: Hepatitis B Surface Antibody: 200.01 m[IU]/mL

## 2023-09-12 LAB — HEPATITIS B (HBV) SURFACE ANTIGEN WITH REFLEX TO CONFIRMATION: Hepatitis B Surface Antigen: NONREACTIVE

## 2023-09-12 LAB — HIV-1/2, ANTIGEN AND ANTIBODY WITH REFLEX TO CONFIRMATION: HIV Ag/Ab 4th Generation: NONREACTIVE

## 2023-09-12 LAB — SYPHILIS SCREEN, IGG AND IGM: Syphilis Screen IgG and IgM: NONREACTIVE

## 2023-09-12 NOTE — Progress Notes (Signed)
 [facility]  OFFICE NOTE         Subjective   Historian: Patient      Chief Complaint   Patient presents with    STI Screening     HPI  Tuana is a 38 y.o. female who presents for STD screening    History:  Medications and Allergies reviewed.   Pertinent Past Medical, Surgical, Family and Social History were reviewed.          Objective     Vitals:    09/12/23 1548   BP: 132/84   BP Site: Left arm   Patient Position: Sitting   Cuff Size: Large   Pulse: 84   Resp: 16   Temp: 98.1 F (36.7 C)   TempSrc: Oral   SpO2: 98%   Weight: 102.2 kg (225 lb 6.4 oz)   Height: 1.727 m (5' 8)     Physical Exam  Constitutional:       General: She is not in acute distress.     Appearance: Normal appearance. She is well-developed. She is not ill-appearing, toxic-appearing or diaphoretic.   HENT:      Head: Normocephalic and atraumatic.     Eyes: EOM are normal. Pupils are equal, round, and reactive to light. Musculoskeletal:         General: Normal range of motion.      Cervical back: Normal range of motion and neck supple.   Neurological:      Mental Status: She is alert and oriented to person, place, and time.      Cranial Nerves: No cranial nerve deficit.      Comments: CN  II - XII intact   Skin:     General: Skin is warm and dry.      Coloration: Skin is not pale.      Findings: No erythema or rash.   Psychiatric:         Behavior: Behavior normal.         Thought Content: Thought content normal.         Judgment: Judgment normal.   Vitals and nursing note reviewed.       Urgent Care Course   There were no labs reviewed with this patient during the visit.    There were no x-rays reviewed with this patient during the visit.      Procedures   Procedures     Assessment / Plan     Differential Diagnoses including but not limited to: possible std exposure  She denies d/c, swelling, sores, rash    SAFE SEX COUNSELED    Patient to follow up with [facility] primary care doctor or GO TO THE E.R. if symptoms worsening  or not resolving as expected.   Patient verbalized understanding.   Reviewed discharge instructions that are included with patient's MyChart. Questions answered.       Laylee was seen today  for sti screening.    Diagnoses and all orders for this visit:    Possible exposure to STD  -     Hepatitis B (HBV) Surface Antigen with Reflex to Confirmation; Future  -     Hepatitis B Surface (HBV) Antibody, Quantitative; Future  -     HIV-1/2, Antigen and Antibody with Reflex to Confirmation; Future  -     Lavender - EDTA Hold Tube; Future  -     Gold SST Hold Tube; Future  -     Hepatitis C Antibody, Total;  Future  -     Gold SST Hold Tube; Future  -     Syphilis Screen, IgG and IgM; Future  -     Chlamydia trachomatis, Neisseria gonorrhoeae, Mycoplasma genitalium and Trichomonas vaginalis; Future  -     Hepatitis B (HBV) Surface Antigen with Reflex to Confirmation  -     Hepatitis B Surface (HBV) Antibody, Quantitative  -     HIV-1/2, Antigen and Antibody with Reflex to Confirmation  -     Lavender - EDTA Hold Tube  -     Gold SST Hold Tube  -     Hepatitis C Antibody, Total  -     Gold SST Hold Tube  -     Syphilis Screen, IgG and IgM  -     Chlamydia trachomatis, Neisseria gonorrhoeae, Mycoplasma genitalium and Trichomonas vaginalis         The indications for early follow-up with PCP and return to UC were discussed. Patient/family received education on the working diagnosis, diagnostic uncertainties, and proposed treatment plan. Indications for emergency evaluation in the ED were reviewed. Written and verbal discharge instructions were provided and discussed and all questions from the patient/family were addressed, with no apparent barriers.

## 2023-09-12 NOTE — Patient Instructions (Signed)
 Sexually Transmitted Infections   A sexually transmitted infection, or STI, is an infection that is spread from person to person through bodily fluids. Most often this happens during unprotected sex.   In some cases, STIs can also spread through blood transfusions, through shared needles, or from a pregnant woman to her child.   STIs are also sometimes called sexually transmitted diseases, or STDs.   STIs include:   Gonorrhea  Chlamydia  Trichomoniasis  Syphilis  Herpes  HIV  Hepatitis  HPV, human papillomavirus, which causes genital warts and may cause cervical cancer  Symptoms   Different STIs have different symptoms. These may include:   Discharge or bleeding from the vagina or penis  Pain in your abdomen or pelvis  Swollen glands or lymph nodes  Sores, ulcers, or bumps  A rash or other skin changes  A sore throat  Fever  Symptoms may appear within days, or may take years to show up. A person can even have an STI and have no symptoms at all.   To diagnose an STI, your care team can examine you and ask about your symptoms and risk factors. They also can take samples of blood, urine, or other tissue to send to a lab for testing.     Treatment   Treatment depends on the type of infection you have. Some STIs can be cured with medicine. For other STIs, medicine can help manage symptoms.   Even when they are treated, some STIs can cause complications, more often in women.   Possible complications include:   Abdominal and pelvic infections  Fertility issues  Problems during pregnancy or with the health of a newborn  Follow all instructions from your care team and follow up to get the results of any tests.   If your care team prescribes medicine, take it exactly as they say. For antibiotics, make sure to finish all doses. If you stop early, the infection may not go away and can become harder to treat.  Do not have sex until your treatment is complete. Tell your sexual partners about the STI so they can also get tested  and treated.  If your symptoms do not get better as expected, call your care team. Also call your care team if you get worse or have more pain, a new fever, or other new symptoms.  Prevention   You can take steps to prevent getting an STI.   Practice safer sex, using a barrier method like a condom each time, to limit risk.  Having sex with 1 partner at a time can make it easier to know your risks.  Get tested on a regular basis to catch any infections early.  There is also a vaccine available for certain types of HPV, which can help prevent cervical cancer.     Safer sex can help protect you from STIs.   Thank You for Choosing Us    Thank you for trusting us  with your care. We are here to support you and want you to feel your best. Contact us  with any questions.   IF YOU HAVE A MEDICAL EMERGENCY, CALL 911 OR GO TO THE EMERGENCY ROOM.   The information presented is intended for general information and educational purposes. It is not intended to replace the advice of your health care provider. Contact your health care provider if you believe you have a health problem.   Last updated January 2025    2025 [employer] All rights reserved.

## 2023-09-13 LAB — LAB USE ONLY - LAVENDER - EDTA HOLD TUBE

## 2023-09-13 LAB — CHLAMYDIA TRACHOMATIS, NEISSERIA GONORRHOEAE, MYCOPLASMA GENITALIUM AND TRICHOMONAS VAGINALIS
Chlamydia trachomatis DNA: NEGATIVE
Mycoplasma genitalium DNA: NEGATIVE
Neisseria gonorrhoeae DNA: NEGATIVE
Trichomonas vaginalis DNA: NEGATIVE

## 2023-09-13 LAB — LAB USE ONLY - GOLD SST HOLD TUBE

## 2023-09-15 ENCOUNTER — Ambulatory Visit (INDEPENDENT_AMBULATORY_CARE_PROVIDER_SITE_OTHER): Payer: Self-pay | Admitting: Family

## 2023-10-14 ENCOUNTER — Other Ambulatory Visit (FREE_STANDING_LABORATORY_FACILITY)

## 2023-10-14 DIAGNOSIS — Z1321 Encounter for screening for nutritional disorder: Secondary | ICD-10-CM

## 2023-10-14 DIAGNOSIS — Z713 Dietary counseling and surveillance: Secondary | ICD-10-CM

## 2023-10-14 DIAGNOSIS — Z903 Acquired absence of stomach [part of]: Secondary | ICD-10-CM

## 2023-10-14 LAB — COMPREHENSIVE METABOLIC PANEL
ALT: 9 U/L (ref <=55)
AST (SGOT): 15 U/L (ref <=41)
Albumin/Globulin Ratio: 1.2 (ref 0.9–2.2)
Albumin: 3.8 g/dL (ref 3.5–5.0)
Alkaline Phosphatase: 54 U/L (ref 37–117)
Anion Gap: 10 (ref 5.0–15.0)
BUN: 10 mg/dL (ref 7–21)
Bilirubin, Total: 0.5 mg/dL (ref 0.2–1.2)
CO2: 24 meq/L (ref 17–29)
Calcium: 9.1 mg/dL (ref 8.5–10.5)
Chloride: 105 meq/L (ref 99–111)
Creatinine: 0.7 mg/dL (ref 0.4–1.0)
GFR: >60 mL/min/1.73 m2 (ref >=60.0)
Globulin: 3.1 g/dL (ref 2.0–3.6)
Glucose: 80 mg/dL (ref 70–100)
Hemolysis Index: 6 {index}
Potassium: 4.2 meq/L (ref 3.5–5.3)
Protein, Total: 6.9 g/dL (ref 6.0–8.3)
Sodium: 139 meq/L (ref 135–145)

## 2023-10-14 LAB — VITAMIN D, 25 OH, TOTAL: Vitamin D 25-OH, Total: 53 ng/mL (ref 30–100)

## 2023-10-14 LAB — IRON PROFILE
Iron Saturation: 28 % (ref 15–50)
Iron: 61 ug/dL (ref 32–157)
TIBC: 217 ug/dL — ABNORMAL LOW (ref 265–497)
UIBC: 156 ug/dL (ref 126–382)

## 2023-10-14 LAB — CBC
Absolute nRBC: 0 x10 3/uL (ref <=0.00)
Hematocrit: 40.4 % (ref 34.7–43.7)
Hemoglobin: 12.8 g/dL (ref 11.4–14.8)
MCH: 28.3 pg (ref 25.1–33.5)
MCHC: 31.7 g/dL (ref 31.5–35.8)
MCV: 89.4 fL (ref 78.0–96.0)
MPV: 12.1 fL (ref 8.9–12.5)
Platelet Count: 222 x10 3/uL (ref 142–346)
RBC: 4.52 x10 6/uL (ref 3.90–5.10)
RDW: 17 % — ABNORMAL HIGH (ref 11–15)
WBC: 6.23 x10 3/uL (ref 3.10–9.50)
nRBC %: 0 /100{WBCs} (ref <=0.0)

## 2023-10-14 LAB — LIPID PANEL
Cholesterol / HDL Ratio: 3.6 {index}
Cholesterol: 173 mg/dL (ref <=199)
HDL: 48 mg/dL (ref >=40)
LDL Calculated: 112 mg/dL — ABNORMAL HIGH (ref 0–99)
Triglycerides: 64 mg/dL (ref 34–149)
VLDL Calculated: 13 mg/dL (ref 10–40)

## 2023-10-14 LAB — FOLATE
Folate: 15.3 ng/mL (ref >5.4)
Hemolysis Index: 2 {index}

## 2023-10-14 LAB — VITAMIN B12: Vitamin B-12: >2000 pg/mL — ABNORMAL HIGH (ref 211–911)

## 2023-10-15 ENCOUNTER — Encounter (INDEPENDENT_AMBULATORY_CARE_PROVIDER_SITE_OTHER): Payer: Self-pay | Admitting: Family

## 2023-10-16 LAB — VITAMIN A: Vitamin A: 21 ug/dL — ABNORMAL LOW (ref 38–98)

## 2023-10-18 LAB — WHOLE BLOOD VITAMIN B1 (THIAMINE): Vitamin B1, Whole Blood: 148 nmol/L (ref 78–185)

## 2023-10-21 ENCOUNTER — Ambulatory Visit (INDEPENDENT_AMBULATORY_CARE_PROVIDER_SITE_OTHER): Admitting: Family

## 2023-10-21 ENCOUNTER — Encounter (INDEPENDENT_AMBULATORY_CARE_PROVIDER_SITE_OTHER): Payer: Self-pay | Admitting: Family

## 2023-10-21 VITALS — BP 95/68 | HR 80 | Ht 68.0 in | Wt 217.0 lb

## 2023-10-21 DIAGNOSIS — Z713 Dietary counseling and surveillance: Secondary | ICD-10-CM

## 2023-10-21 DIAGNOSIS — Z903 Acquired absence of stomach [part of]: Secondary | ICD-10-CM

## 2023-10-21 DIAGNOSIS — Z9884 Bariatric surgery status: Secondary | ICD-10-CM
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
## 2023-11-10 ENCOUNTER — Other Ambulatory Visit: Payer: Self-pay

## 2023-11-11 ENCOUNTER — Telehealth (HOSPITAL_BASED_OUTPATIENT_CLINIC_OR_DEPARTMENT_OTHER): Admitting: Licensed Professional Counselor

## 2023-11-11 ENCOUNTER — Other Ambulatory Visit: Payer: Self-pay

## 2023-11-12 ENCOUNTER — Other Ambulatory Visit: Payer: Self-pay

## 2023-11-13 ENCOUNTER — Other Ambulatory Visit: Payer: Self-pay

## 2023-11-18 ENCOUNTER — Encounter (INDEPENDENT_AMBULATORY_CARE_PROVIDER_SITE_OTHER): Payer: Self-pay | Admitting: Certified Nurse Midwife

## 2023-11-18 ENCOUNTER — Ambulatory Visit: Attending: Certified Nurse Midwife | Admitting: Certified Nurse Midwife

## 2023-11-18 ENCOUNTER — Ambulatory Visit (INDEPENDENT_AMBULATORY_CARE_PROVIDER_SITE_OTHER): Admitting: Certified Nurse Midwife

## 2023-11-18 VITALS — BP 110/80 | HR 68 | Temp 98.1°F | Resp 20 | Ht 68.0 in | Wt 209.6 lb

## 2023-11-18 DIAGNOSIS — Z124 Encounter for screening for malignant neoplasm of cervix: Secondary | ICD-10-CM

## 2023-11-18 DIAGNOSIS — Z86018 Personal history of other benign neoplasm: Secondary | ICD-10-CM

## 2023-11-18 DIAGNOSIS — Z1239 Encounter for other screening for malignant neoplasm of breast: Secondary | ICD-10-CM

## 2023-11-18 DIAGNOSIS — L988 Other specified disorders of the skin and subcutaneous tissue: Secondary | ICD-10-CM

## 2023-11-18 DIAGNOSIS — Z8742 Personal history of other diseases of the female genital tract: Secondary | ICD-10-CM

## 2023-11-18 DIAGNOSIS — Z01419 Encounter for gynecological examination (general) (routine) without abnormal findings: Secondary | ICD-10-CM
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
## 2023-11-18 NOTE — Progress Notes (Deleted)
 Last Pap unknown

## 2023-11-19 ENCOUNTER — Other Ambulatory Visit (INDEPENDENT_AMBULATORY_CARE_PROVIDER_SITE_OTHER): Payer: Self-pay | Admitting: Certified Nurse Midwife

## 2023-11-19 ENCOUNTER — Ambulatory Visit (INDEPENDENT_AMBULATORY_CARE_PROVIDER_SITE_OTHER): Payer: Self-pay | Admitting: Certified Nurse Midwife

## 2023-11-19 DIAGNOSIS — L988 Other specified disorders of the skin and subcutaneous tissue: Secondary | ICD-10-CM

## 2023-11-19 LAB — VAGINAL BACTERIAL VAGINOSIS, CANDIDA SPP., AND TRICHOMONAS VAGINALIS, PCR
Bacterial vaginosis marker DNA: NOT DETECTED
Candida glabrata DNA: NOT DETECTED
Candida group DNA: NOT DETECTED
Candida krusei DNA: NOT DETECTED
Trichomonas vaginalis DNA: NOT DETECTED

## 2023-11-24 ENCOUNTER — Ambulatory Visit

## 2023-11-24 LAB — HUMAN PAPILLOMA VIRUS (HPV) DNA, HIGH RISK
HPV Genotype 16 DNA: NEGATIVE
HPV Genotype 18 DNA: NEGATIVE
HPV High Risk Other DNA: POSITIVE — AB

## 2023-11-24 LAB — LAB USE ONLY- GYN CYTOLOGY/PAP SMEAR

## 2023-11-25 NOTE — Telephone Encounter (Signed)
 Called patient. Name and DOB verified. Reviewed pap smear results. +LSIL, +HPV. Reviewed recommendation of colposcopy. Counseled on colposcopy. She is agreeable. Reviewed with Dr. Omar who is able to complete. [place] notified and will call to help schedule patient.

## 2023-11-30 NOTE — Progress Notes (Unsigned)
Colposcopy Procedure Note     Indications:LGSIL     Procedure Details   The risks and benefits of the procedure and verbal and written informed consent obtained.     Speculum placed in vagina and excellent visualization of cervix achieved, cervix swabbed x 3 with acetic acid solution.     Findings:  Cervix: visible lesion(s) at *** o'clock and acetowhite lesion(s) noted at *** o'clock; endocervical curettage performed, cervical biopsies taken at *** and *** o'clock, specimen labelled and sent to pathology and hemostasis achieved with Monsel's solution.     Specimens: Cervical biopsy at ***     Complications: none.     Plan:  Specimens labelled and sent to Pathology.  Will base further treatment on Pathology findings.  Post biopsy instructions given to patient.

## 2023-12-01 ENCOUNTER — Encounter (INDEPENDENT_AMBULATORY_CARE_PROVIDER_SITE_OTHER): Payer: Self-pay | Admitting: Obstetrics & Gynecology

## 2023-12-01 ENCOUNTER — Ambulatory Visit (INDEPENDENT_AMBULATORY_CARE_PROVIDER_SITE_OTHER): Admitting: Obstetrics & Gynecology

## 2023-12-01 ENCOUNTER — Ambulatory Visit: Attending: Obstetrics & Gynecology | Admitting: Obstetrics & Gynecology

## 2023-12-01 VITALS — BP 119/81 | HR 80 | Temp 98.0°F | Wt 206.0 lb

## 2023-12-01 DIAGNOSIS — R87612 Low grade squamous intraepithelial lesion on cytologic smear of cervix (LGSIL): Secondary | ICD-10-CM

## 2023-12-01 DIAGNOSIS — Z3202 Encounter for pregnancy test, result negative: Secondary | ICD-10-CM

## 2023-12-01 LAB — POCT PREGNANCY TEST, URINE HCG: POCT Pregnancy HCG Test, UR: NEGATIVE

## 2023-12-04 LAB — SURGICAL PATHOLOGY: Clinical Information: HIGH

## 2023-12-09 ENCOUNTER — Other Ambulatory Visit: Payer: Self-pay | Admitting: Certified Nurse Midwife

## 2023-12-09 ENCOUNTER — Ambulatory Visit (INDEPENDENT_AMBULATORY_CARE_PROVIDER_SITE_OTHER): Payer: Self-pay | Admitting: Obstetrics & Gynecology

## 2023-12-10 ENCOUNTER — Ambulatory Visit (INDEPENDENT_AMBULATORY_CARE_PROVIDER_SITE_OTHER): Payer: Self-pay | Admitting: Certified Nurse Midwife

## 2023-12-10 DIAGNOSIS — N6310 Unspecified lump in the right breast, unspecified quadrant: Secondary | ICD-10-CM

## 2023-12-14 ENCOUNTER — Other Ambulatory Visit (INDEPENDENT_AMBULATORY_CARE_PROVIDER_SITE_OTHER): Payer: Self-pay | Admitting: Family

## 2023-12-14 ENCOUNTER — Other Ambulatory Visit: Payer: Self-pay | Admitting: Neurology

## 2023-12-14 DIAGNOSIS — G43E09 Chronic migraine with aura, not intractable, without status migrainosus: Secondary | ICD-10-CM

## 2023-12-15 MED ORDER — FAMOTIDINE 20 MG PO TABS
20.0000 mg | ORAL_TABLET | Freq: Every day | ORAL | 5 refills | Status: AC
Start: 2023-12-15 — End: ?

## 2023-12-18 ENCOUNTER — Ambulatory Visit: Attending: Certified Nurse Midwife

## 2023-12-18 DIAGNOSIS — Z86018 Personal history of other benign neoplasm: Secondary | ICD-10-CM | POA: Insufficient documentation

## 2023-12-20 NOTE — Progress Notes (Deleted)
 Outpatient Services Individual Bariatric Behavioral Health Follow Up Note    12/20/2023    Start:  {am/pm:31393} End:   {am/pm:31393}    Visit Modality: {MODALITYOPTIONS:69468}    Interpreter present? {yes/no:311178}    Reason for appointment: Tonya Camacho presents for a behavioral health follow up appointment in support of an optimal outcome for SG bariatric surgery date 07/14/23     Current weight:  Lbs loss:  Goal weight:    Compliance with lifestyle/dietary changes and household adjustments:    Episodes of eating more than you intended, even small amounts, outside of planned meals/snacks:   Episodes of eating to cope with stress, boredom, or emotions:  3 months: assess adherence, loss-of-control eating/grazing, body-image shifts, relationship stressors; skills tune-up (CBT/ACT/MI).  6 months: screen for depression/anxiety, binge/compulsive eating; troubleshoot plateaus; values-based relapse-prevention.  9-12 months: identity changes, loose-skin distress, social eating, holidays; plan for year-2 maintenance.    Non-Scale Victories:    Body dysmorphia:    Discomfort around external comments re: weight loss:    Behavioral components of weight gain:      Stressors:      Strengths:      Stated emotional needs being met with food:      Pertinent family history:      Current Exercise Habits {exercise types:16438}      Future behavioral goals:      Barriers to compliance:      Support and/or Support Groups:      Substance Use:  Tobacco use: {yes***/no:17258}  Substance Use:  {Substance Use:34580}      H/O Psychiatric Symptoms:  Anxiety: dx 2020, worry, current symptoms mild-moderate  Eating Disorders: Denies associated symptoms.  History of disordered eating requiring treatment: Patient denies  Concerns about disordered eating behavior now: Patient denies  Depression: dx 2020, lack of motivation, mood swings,depressed mood, in grad school, in remission since 2022  Mania: Denies associated symptoms.  OCD:  NA  Psychosis: Patient denies  Other: Patient denies    Previous Treatment/diagnosis:  Out patient therapy; currently weekly;  past hx 2019-2022; 2016     Assessments and inventories:     Completed Screening Measures:       04/17/2023    12:56 PM 01/06/2023    12:01 AM   PHQ2/PHQ9 SCORE    PHQ2 Score 0   0    PHQ9 Score 1  0       Patient-reported    Data saved with a previous flowsheet row definition         04/17/2023    12:55 PM   GAD-7 Scores   Feeling nervous, anxious or on edge 1    Not being able to stop or control worrying 0    Worrying too much about different things 0    Trouble relaxing 1    Being so restless that it is hard to sit still 0    Feeling afraid as if something awful might happen 0    GAD-7 Total Score 2        Patient-reported                 {BH DEPRESSION PLAN SMARTLIST:[id number]}     Mental Status Exam:   General Appearance: {BHGeneral/Appearance:46759::neatly groomed,adequately nourished,casually dressed}  Behavior/Psychomotor: {BHBehaviorPsychomotor:46760::normal ,good eye contact}  Mood: {BHMood:46763::euthymic}  Affect: {BHAffect:46764::congruent with mood}  Speech: {BHSpeech:46762::normal pitch,normal volume}  Language: {BHLanguage:46769::verbal expression is clear ,has comprehensible ideas through verbal articulation,ideas are conveyed and properly produced}  Thought Form/Process: {BHThoughtForm/Process:46765::well organized,associations  intact,goal directed}  Thought Content: {BHThoughtContent:46767::absent of psychotic symptoms,absent of suicidal or homicidal ideation}  Perception /Sensorium: {BHPerception/Sensorium:46768::clear, intact and makes sense of stimuli received}  Judgment: {BHJudgement:46791::good}  Insight: {BHInsight:46790::good}  Cognitive Functioning: {BHCognitiveFunctioning:46776::cognition grossly intact,alert,oriented to person,oriented to place,oriented to time,oriented to situation,memory intact,attentive}      Risk Assessment:   Risk factors: None reported or assessed  Protective Factors: Identifies reasons for living Supportive social network of family or friends and Engaged in work or school  Suicide Attempts/Gestures/Thoughts/Plan:-No suicidal thoughts, no intent, no plan  Homicidal Thoughts/Plan/Gestures:-No homicidal thoughts, no intent, no plan.  Recent Physical Aggression/Violence/Anger:Denies associated symptoms.  Access to Weapons: none    Diagnosis & Problem:      No diagnosis found.  Patient Active Problem List    Diagnosis Date Noted    Morbid obesity (CMS/HCC) 07/14/2023    Pure hypercholesterolemia 06/17/2023    Vitamin D  deficiency 06/17/2023    Morbid obesity with BMI of 40.0-44.9, adult (CMS/HCC) 04/01/2023    OSA (obstructive sleep apnea) 04/01/2023    Anxiety     Depression        Clinical Summary & Plan:     Tonya Camacho presents with cognitive factors and behavioral adjustments that support an optimal outcome for SG bariatric surgery date 07/14/23.  Patient asserts access to appropriate resources and commitment to the post-operative bariatric program.    Tonya Camacho challenges to focus on are in the following areas:  ***    Tonya Camacho strengths are: ***    Clinical Recommendations and Plan:   Patient asserts readiness for surgery  ***    The patient demonstrated capacity, understood and agreed with this plan of care and practice guidelines. An opportunity for questions was given. The patient {IS SAFE:19720} to leave from appointment.    Referrals provided: {Yes No N/A:30542}    Tonya Camacho, [facility]  Therapist IV  Integrated Behavioral Health

## 2023-12-21 ENCOUNTER — Telehealth (HOSPITAL_BASED_OUTPATIENT_CLINIC_OR_DEPARTMENT_OTHER): Admitting: Licensed Professional Counselor

## 2023-12-23 ENCOUNTER — Telehealth (INDEPENDENT_AMBULATORY_CARE_PROVIDER_SITE_OTHER): Payer: Self-pay | Admitting: Certified Nurse Midwife

## 2023-12-23 ENCOUNTER — Telehealth (INDEPENDENT_AMBULATORY_CARE_PROVIDER_SITE_OTHER): Admitting: Certified Nurse Midwife

## 2023-12-23 ENCOUNTER — Encounter (INDEPENDENT_AMBULATORY_CARE_PROVIDER_SITE_OTHER): Payer: Self-pay | Admitting: Certified Nurse Midwife

## 2023-12-23 DIAGNOSIS — D259 Leiomyoma of uterus, unspecified: Secondary | ICD-10-CM

## 2023-12-23 NOTE — Telephone Encounter (Signed)
 Called to review ultrasound report. No answer. Left voicemail asking to call back.
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
## 2023-12-24 ENCOUNTER — Encounter (INDEPENDENT_AMBULATORY_CARE_PROVIDER_SITE_OTHER): Payer: Self-pay

## 2023-12-24 ENCOUNTER — Telehealth: Payer: Self-pay

## 2023-12-24 NOTE — Telephone Encounter (Signed)
 Who is providing the information:    [x] Patient               [] Other (enter contact information)     Reason for Visit:    [] Breast Cancer - new diagnosis  [] Breast Pain  [] Breast Surgery Consult (fibroadenoma, papilloma, radial scar, ALH, ADH, LCIS)  [] Lump or Mass without biopsy (schedule first visit with NP or document why not seeing NP first)  [x] Other - Right breast mass - Imaging done at [facility].      Location Preference:    [] [place]               [x] [place]               [] [place]                   []                   [] [place]       [] First Available    Physician/APP Preference:    [] Rosaline Councilman, MD           [] Camie Pin, MD  [] Priscilla Boards, MD       [] Abigail Lemma, MD            [] Steva Flemings, MD               [] Arnette Rouse, MD                [] Tinnie Peoples, MD                [] Freddy Pace, MD               [] First Available     [] Dimas Mustard, NP               [] Wanda Lew, NP  [] Lauraine Sierra, NP              [] Maeola Birmingham, NP     Referral:    Referring Provider: Truman Tinnie       Is an insurance referral required? No      Does the patient need to be referred to the Oncology Financial Navigator? No       Personal History:    Personal history of Breast Cancer: No   (if yes, fill out information below)    Site [] Left  [] Right  [] Bilateral Date:     Surgical Intervention [] Yes  [] No If Yes:  [] Lumpectomy  [] Mastectomy  Date:  Location:    Non-surgical Intervention [] Yes  [] No [] Prior Chemotherapy  [] Prior Radiation   Hormone Receptor Testing [] Yes  [] No If Yes:  Date:  Location:   FISH Testing [] Yes  [] No If Yes:  Date:  Location:     Breast Surgery History:    Has the patient had breast surgery? No (if yes, fill out information below)    Mastectomy [] Yes     [] Right     [] Left     [] Bilateral  [] No If yes:  Date:  Facility: Postsurgical testing:  [] Receptor      [] ER+      [] PR+      [] HER2+  [] Oncotype  [] None   Lumpectomy [] Yes     [] Right     [] Left      [] Bilateral  [] No If yes:  Date(s):  Facility: Postsurgical testing:  [] Receptor      [] ER+      [] PR+      [] HER2+  []   Oncotype  [] None     Personal history of Ovarian Cancer: No   (if yes, fill out information below)    Date:     [] Chemotherapy  [] Radiation  [] Neither    Personal history of any other type of cancer: No (if yes, fill out information below)    Date:                               Type of Cancer:  [] Chemotherapy  [] Radiation  [] Neither     Family History:    Family history of breast cancer: No    Family history of ovarian cancer: No    Family history of any other type of cancer: No (if yes, document information below)    Ashkenazi Jewish Ancestry: No     Genetic Testing:    Has the patient had any genetic testing done? No     Imaging History:    Mammogram [x] Yes  [] No If yes:  Date: 12/09/2023  Facility: [facility]   Breast MRI [] Yes  [] No If yes:  Date:  Facility:   Breast Ultrasound [x] Yes  [] No If yes:  Date:12/09/2023  Facility: [facility]        Breast Biopsy History:    Has the patient ever had a breast biopsy? No (if yes, fill out information below)    Ultrasound-Guided Biopsy [] Yes      [] Right      [] Left  [] No If yes:  Date(s):  Facility where done:   MRI-Guided Biopsy [] Yes      [] Right      [] Left  [] No If yes:  Date(s):  Facility where done:   Stereotactic Biopsy  [] Yes       [] Right       [] Left  [] No If yes:  Date(s):  Facility where done:   Surgical Biopsy [] Yes      [] Right      [] Left  [] No If Yes:  Date(s):  Facility where done:   Core Needle Biopsy [] Yes      [] Right      [] Left  [] No If Yes:  Date(s):  Facility where done:   Unknown type [] Yes      [] Right      [] Left  [] No If yes:  Date(s):  Facility where done:        Other:    Is a Release of Information form needed by the patient to acquire records/imaging/pathology? No      ROI form sent to patient - No      Patient advised of need to have records in office prior to appointment? No      Appointment scheduled with NP Dimas Mustard on  Wednesday 11/12.

## 2023-12-29 ENCOUNTER — Ambulatory Visit (INDEPENDENT_AMBULATORY_CARE_PROVIDER_SITE_OTHER): Admitting: Registered"

## 2023-12-30 ENCOUNTER — Ambulatory Visit: Attending: Surgery | Admitting: Surgical Oncology

## 2023-12-30 ENCOUNTER — Encounter: Payer: Self-pay | Admitting: Surgical Oncology

## 2023-12-30 VITALS — BP 113/77 | HR 82 | Temp 98.0°F | Resp 16 | Ht 68.0 in | Wt 203.6 lb

## 2023-12-30 DIAGNOSIS — R928 Other abnormal and inconclusive findings on diagnostic imaging of breast: Secondary | ICD-10-CM

## 2023-12-30 DIAGNOSIS — N6310 Unspecified lump in the right breast, unspecified quadrant: Secondary | ICD-10-CM

## 2023-12-30 NOTE — Progress Notes (Signed)
 [facility]  OFFICE VISIT          HPI   Tonya Camacho is a very pleasant 38 y.o. female who presents at the request of Agyekum, Lauren CNM for evaluation of a right breast mass and abnormal imaging.  History of Present Illness  Tonya Camacho had a routine exam with gyn (CNM Agyekum) and a mass was noted in her right breast lower inner quadrant.  At that time the mass was inflammed and draining, has healed now.  Diagnostic imaging incidentally revealed 2 additional finding in her right breast, requiring follow up.    12/09/2023 diagnostic bilateral mammogram with right breast ultrasound showed  PARENCHYMAL PATTERN:  There are scattered areas of fibroglandular density.  - MAMMOGRAM:  Single view asymmetry in the anterior depth lateral right breast seen on the CC view.  There is a low density oval mass with circumscribed margins in the middle depth upper outer right breast.  No suspicious mass, calcifications, or area of architectural distortion in the left breast.  - ULTRASOUND:    - right breast at the palpable area of concern is an intradermal lesion at 4:00, 6 CFN measuring 1.0 x 0.7 x 0.4 cm, suggestive of sebaceous cyst.    - Corresponding to the upper outer mammographic right breast mass-  probably benign complicated cyst versus an intramammary lymph node at 10:00, 7 CFN approximately 0.6 x 0.5 x 0.2 cm.  - There is an adjacent benign 0.4 cm intramammary lymph node in the right breast at 9:30, 7 CFN,     IMPRESSION:   1. Probably benign right breast asymmetry without a suspicious sonographic correlate. Recommend a diagnostic mammogram in 6 months to confirm stability.  2. Probably benign right breast mass at 10:00. Recommend a targeted ultrasound in 6 months.   3. Palpable benign sebaceous cyst in the right breast at 4:00. Further evaluation should be based upon the clinical examination.   BIRADS CATEGORY 03-PROBABLY BENIGN FINDING- Short Interval Follow-up Suggested (3A)     No prior history  of cyst aspirations, breast surgery, breast biopsies or trauma to the breast.  She denies a family history of breast or other cancers.     Heavy menses - had pelvic US  12/19/2023 - will see fibroid specialist.        Gynecologic History    Age at first period: 14   Total number of pregnancies: 0   Total # of births:    How old were you when you had your first baby?    Age of menopause:    Have you ever used any birth control with hormones (i.e. Birth control pills, IUD with hormones, implant)? Yes 16-20 yo   Please provide details of type and length of treatment. Birth control pills   Have you ever used fertility medications? No   Have you ever used hormone replacement therapy? No   Bra size: 38 DDD   Do you have any Ashkenazi Jewish ancestry? No   Is there any other breast history that your team should know about (i.e. nipple piercing, abscesses, abnormal mammograms)          Plastic Surgery Yes   Comments? Lipoma removal left hip.   Occurrence date (Please enter the date in the format mm/dd/yyyy. If you are unsure of the exact date, please provide an approximate date)      She has a past medical history of Anxiety, Depression, Environmental allergies, Gastroesophageal reflux disease, Headache, Hyperlipidemia, Influenza A (03/2023),  Morbid obesity with BMI of 40.0-44.9, adult (CMS/HCC), Pneumonia, Seasonal allergic rhinitis, Sleep apnea, and STD (sexually transmitted disease).    She has a past surgical history that includes TONSILLECTOMY; EXCISION CYST/LIPOMA; ROBOT XI ASSISTED,LAPAROSCOPIC,SLEEVE GASTRECTOMY (N/A, 07/14/2023); LAPAROSCOPIC, OMENTOPEXY (N/A, 07/14/2023); and Cosmetic surgery.      Current Outpatient Medications   Medication Instructions    albuterol  sulfate HFA (PROVENTIL ) 108 (90 Base) MCG/ACT inhaler 2 puffs, Every 4 hours PRN    B Complex-Biotin-FA (VITAMIN B50 COMPLEX PO) Take by mouth    Biotin 1 MG Cap Take by mouth    butalbital -acetaminophen -caffeine  (FIORICET) 50-325-40 MG per tablet 1  tablet, Oral, Every 4 hours PRN    Calcium Citrate-Vitamin D  315-5 MG-MCG Tablet 500 mg    famotidine  (PEPCID ) 20 mg, Oral, Daily    ferrous sulfate 36 mg, Every morning with breakfast    lansoprazole  (PREVACID ) 30 mg, Oral, Daily    Magnesium  400 mg, Oral, Daily    Multiple Vitamins-Minerals (multivitamin with minerals) tablet 1 tablet, Daily    ondansetron  (ZOFRAN -ODT) 4 mg, Oral, Every 8 hours PRN    promethazine  (PHENERGAN ) 12.5 mg, Oral, 4 times daily PRN    valACYclovir  HCL (VALTREX ) 500 mg, Oral, 2 times daily    vitamin B-12 (CYANOCOBALAMIN) 50 mcg, Daily    vitamin D  (cholecalciferol) 10 mcg, Daily     Allergies   Allergen Reactions    Doxycycline Hives, Itching and Nausea And Vomiting    Penicillins Hives and Itching    Unknown [Other Drug]      Unknown eye drop - made infection worse      The family history includes Alcohol abuse in her father and paternal grandfather; Arthritis in her father and mother; Asthma in her father; Dementia in her maternal grandmother; Diabetes in her brother and father; Early death in her brother; Hypertension in her father and mother; Kidney failure in her brother; Migraines in her paternal grandmother; Obesity in her mother, sister, and sister.     She reports that she has never smoked. She has never used smokeless tobacco. She reports current alcohol use. She reports that she does not use drugs.    She lives in [place].          Vital Signs   BP 113/77 (BP Site: Left arm, Patient Position: Sitting, Cuff Size: Large)   Pulse 82   Temp 98 F (36.7 C) (Oral)   Resp 16   Ht 1.727 m (5' 8)   Wt 92.4 kg (203 lb 9.6 oz)   SpO2 100%   BMI 30.96 kg/m   Physical Exam  Constitutional:       General: She is not in acute distress.     Appearance: Normal appearance.   HENT:      Head: Normocephalic.   Eyes:      General: No scleral icterus.     Conjunctiva/sclera: Conjunctivae normal.   Cardiovascular:      Rate and Rhythm: Normal rate and regular rhythm.   Pulmonary:       Effort: Pulmonary effort is normal.      Breath sounds: Normal breath sounds.   Chest:   Breasts:     Right: No swelling, bleeding, inverted nipple, mass, nipple discharge, skin change or tenderness.      Left: No swelling, bleeding, inverted nipple, mass, nipple discharge, skin change or tenderness.          Comments: Hypopigmented macular lesion 4:00 radian, 10-11CFN, no palpable mass; no palpable mass  with attention to right 9:00 / UOQ region; dense nodularity in both breasts, no focal pain  Abdominal:      General: Abdomen is flat. There is no distension.   Musculoskeletal:         General: Normal range of motion.      Cervical back: Normal range of motion and neck supple.   Lymphadenopathy:      Upper Body:      Right upper body: No supraclavicular or axillary adenopathy.      Left upper body: No supraclavicular or axillary adenopathy.   Skin:     General: Skin is warm and dry.      Coloration: Skin is not jaundiced.      Comments: Large tattoos left upper arm/ deltoid and right shoulder/ right clavicular region   Neurological:      General: No focal deficit present.      Mental Status: She is alert.   Psychiatric:         Mood and Affect: Mood normal.         Behavior: Behavior normal.        Review of Imaging - see HPI    Assessment/Plan   38 y.o. with right breast sebaceous cyst   two additional sonographic lesions noted 11/2023, BI-RADS 3.  Assessment & Plan  Mass of right breast, unspecified quadrant  Discussed management of the sebaceous cyst - it has calmed down with no evidence of infeciton, will follow.  If it becomes inflammed again, we will repeat breast imaging.  Orders:    [facility]    Abnormal findings on diagnostic imaging of breast  Together we discussed the findings on her recent mammogram and ultrasound and I demonstrated the films to her.  This is her baseline study.    - The 2 mammographic asymmetries in the right breast are non-palpable.  Options discussed  include 6 month follow up vs core needle biopsy vs excision.  The findings appear benign with low index of suspicion for malignancy - she is comfortable with observation.  Right diagnostic mammogram and right breast ultrasound due 05/2024   - orders already in chart from CNM  - See me after imaging, sooner PRN due 06/2024    Dimas Larwance Mustard, MSN, CRNP  Nurse Practitioner, Breast Surgery   [facility]  Office [phone]       12/30/2023         Dimas Larwance Mustard, MSN, CRNP  Nurse Practitioner, Breast Surgery   [facility]  Office [phone]       12/30/2023

## 2023-12-30 NOTE — Patient Instructions (Signed)
 Thank you for choosing your care with us  at [facility].    If any questions after today 's visit, please call our Office [phone]     As per your request, a copy of today 's visit note will be sent to:   lauren Agyekum, CNM  Recommended breast imaging:  Diagnostic right mammogram with right breast ultrasound - due April 2026  Schedule a follow up appointment after imaging   Please call or make an appointment sooner if you have any questions or concerns about your breast health.

## 2024-01-08 ENCOUNTER — Other Ambulatory Visit (INDEPENDENT_AMBULATORY_CARE_PROVIDER_SITE_OTHER)

## 2024-01-08 DIAGNOSIS — Z903 Acquired absence of stomach [part of]: Secondary | ICD-10-CM

## 2024-01-08 DIAGNOSIS — Z713 Dietary counseling and surveillance: Secondary | ICD-10-CM

## 2024-01-08 DIAGNOSIS — Z9884 Bariatric surgery status: Secondary | ICD-10-CM

## 2024-01-08 LAB — CBC
Absolute nRBC: 0 x10 3/uL (ref <=0.00)
Hematocrit: 39.4 % (ref 34.7–43.7)
Hemoglobin: 13.1 g/dL (ref 11.4–14.8)
MCH: 29.3 pg (ref 25.1–33.5)
MCHC: 33.2 g/dL (ref 31.5–35.8)
MCV: 88.1 fL (ref 78.0–96.0)
MPV: 10.9 fL (ref 8.9–12.5)
Platelet Count: 251 x10 3/uL (ref 142–346)
RBC: 4.47 x10 6/uL (ref 3.90–5.10)
RDW: 14 % (ref 11–15)
WBC: 6.82 x10 3/uL (ref 3.10–9.50)
nRBC %: 0 /100{WBCs} (ref <=0.0)

## 2024-01-08 LAB — COMPREHENSIVE METABOLIC PANEL
ALT: 10 U/L (ref <=55)
AST (SGOT): 14 U/L (ref <=41)
Albumin/Globulin Ratio: 1.4 (ref 0.9–2.2)
Albumin: 4.2 g/dL (ref 3.5–4.9)
Alkaline Phosphatase: 51 U/L (ref 37–117)
Anion Gap: 8 (ref 5.0–15.0)
BUN: 13 mg/dL (ref 7–21)
Bilirubin, Total: 0.4 mg/dL (ref 0.2–1.2)
CO2: 25 meq/L (ref 17–29)
Calcium: 9.3 mg/dL (ref 8.5–10.5)
Chloride: 107 meq/L (ref 99–111)
Creatinine: 0.8 mg/dL (ref 0.4–1.0)
GFR: >60 mL/min/1.73 m2 (ref >=60.0)
Globulin: 2.9 g/dL (ref 2.0–3.6)
Glucose: 83 mg/dL (ref 70–100)
Hemolysis Index: 2 {index}
Potassium: 4 meq/L (ref 3.5–5.3)
Protein, Total: 7.1 g/dL (ref 6.0–8.3)
Sodium: 140 meq/L (ref 135–145)

## 2024-01-08 LAB — IRON PROFILE
Iron Saturation: 28 % (ref 15–50)
Iron: 65 ug/dL (ref 32–157)
TIBC: 229 ug/dL — ABNORMAL LOW (ref 265–497)
UIBC: 164 ug/dL (ref 126–382)

## 2024-01-08 LAB — VITAMIN B12: Vitamin B-12: 974 pg/mL — ABNORMAL HIGH (ref 211–911)

## 2024-01-11 LAB — VITAMIN A: Vitamin A: 35 ug/dL — ABNORMAL LOW (ref 38–98)

## 2024-01-20 ENCOUNTER — Encounter (INDEPENDENT_AMBULATORY_CARE_PROVIDER_SITE_OTHER): Payer: Self-pay | Admitting: Family

## 2024-01-20 ENCOUNTER — Ambulatory Visit (INDEPENDENT_AMBULATORY_CARE_PROVIDER_SITE_OTHER): Admitting: Family

## 2024-01-20 VITALS — BP 105/71 | HR 72 | Ht 68.0 in | Wt 201.0 lb

## 2024-01-20 DIAGNOSIS — K912 Postsurgical malabsorption, not elsewhere classified: Secondary | ICD-10-CM

## 2024-01-20 DIAGNOSIS — Z713 Dietary counseling and surveillance: Secondary | ICD-10-CM

## 2024-01-20 DIAGNOSIS — Z1321 Encounter for screening for nutritional disorder: Secondary | ICD-10-CM

## 2024-01-20 DIAGNOSIS — Z903 Acquired absence of stomach [part of]: Secondary | ICD-10-CM
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
## 2024-01-28 ENCOUNTER — Ambulatory Visit (INDEPENDENT_AMBULATORY_CARE_PROVIDER_SITE_OTHER): Admitting: Obstetrics & Gynecology

## 2024-01-28 ENCOUNTER — Encounter (INDEPENDENT_AMBULATORY_CARE_PROVIDER_SITE_OTHER): Payer: Self-pay | Admitting: Obstetrics & Gynecology

## 2024-01-28 VITALS — BP 106/67 | HR 67 | Temp 98.0°F | Wt 202.0 lb

## 2024-01-28 DIAGNOSIS — D219 Benign neoplasm of connective and other soft tissue, unspecified: Secondary | ICD-10-CM
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
## 2024-02-09 ENCOUNTER — Telehealth (INDEPENDENT_AMBULATORY_CARE_PROVIDER_SITE_OTHER): Admitting: Registered"

## 2024-02-09 DIAGNOSIS — Z719 Counseling, unspecified: Secondary | ICD-10-CM

## 2024-02-09 DIAGNOSIS — Z713 Dietary counseling and surveillance: Secondary | ICD-10-CM

## 2024-02-09 NOTE — Progress Notes (Signed)
 Verbal consent has been obtained from the patient to conduct a telephone/video visit encounter to minimize exposure to COVID-19: YES    S:  Pt presents for 7 month post sleeve surgery diet follow up.  Pt has lost 55 pounds at this time and reports being very happy with weight loss.  Pt states consuming about 4 oz per meal.  Pt reports that they are measuring their portions.  Pt reports tolerating the diet well at this time.  Pt continues with all recommended vitamin/mineral supplements at this time.  They also report continued compliance with the 30/30 rule and other mindful eating recommendations.  Pt also reports consuming 45 grams protein daily from a supplemental source.  For physical activity, pt reports participating in:  walking.  Currently, pt is able to consume about 32-48 oz clear fluids daily.  Pt reports no issues with N/V/C/D at this time.    O:  Today 's Wt:   196 lbs    Previous Visit Wt:    Wt Readings from Last 10 Encounters:   01/28/24 202 lb   01/20/24 201 lb   12/30/23 203 lb 9.6 oz   12/01/23 206 lb   11/18/23 209 lb 9.6 oz   10/21/23 217 lb   09/12/23 225 lb 6.4 oz   09/08/23 228 lb 3.2 oz   09/07/23 228 lb 9.6 oz   07/28/23 236 lb     BMI:  There is no height or weight on file to calculate BMI.  Total Wt Loss:  55 lbs  Surgery Date:  07/14/23    Allergies:  Allergies[1]    Diet Recall:   Breakfast:  scrambled egg w turkey sausage   Lunch:  turkey deli meat roll ups w cream cheese   Dinner:  chicken wings/ground turkey burger w broccoli/green beans   Snacks:  protein shakes   Beverages:  water , sugar free juice, coffee   Alcohol:  none    A:  Per diet recall, pt consumes appropriate portion sizes daily.  Food choices are acceptable and appropriate - most are high in lean protein and produce.  Pt appears motivated to continue monitoring calories, exercising regularly and following recommended dietary guidelines to continue and then maintain weight loss.    P:  1.  Pt to maintain portion sizes to  4-5 oz per meal.  Pt encouraged to measure all portions.  2.  Pt to continue using protein supplements for snacks at this time.  Pt to d/c all other snacking at this time.  3.  Pt to consume 64 oz of clear fluids daily.  4.  Pt to continue with physical activity as able.  5.  Pt to f/u in 3 months or PRN.  Pt provided contact information for any questions or concerns.    Spent a total of 15 minutes educating pt in a individual one-on-one setting.  Plan reviewed with surgeon.        [1]   Allergies  Allergen Reactions    Doxycycline Hives, Itching and Nausea And Vomiting    Penicillins Hives and Itching    Unknown [Other Drug]      Unknown eye drop - made infection worse

## 2024-02-24 ENCOUNTER — Ambulatory Visit

## 2024-03-09 ENCOUNTER — Ambulatory Visit: Admitting: Neurology

## 2024-03-18 ENCOUNTER — Encounter (INDEPENDENT_AMBULATORY_CARE_PROVIDER_SITE_OTHER): Payer: Self-pay | Admitting: Family

## 2024-04-19 ENCOUNTER — Telehealth (INDEPENDENT_AMBULATORY_CARE_PROVIDER_SITE_OTHER): Admitting: Family

## 2024-06-20 ENCOUNTER — Ambulatory Visit: Admitting: Surgical Oncology
